# Patient Record
Sex: Male | Born: 1985 | Hispanic: Yes | Marital: Single | Smoking: Former smoker
Health system: Southern US, Community
[De-identification: ages and names within clinical notes are randomized; demographics above are authoritative.]

## PROBLEM LIST (undated history)

## (undated) DIAGNOSIS — R0600 Dyspnea, unspecified: Secondary | ICD-10-CM

## (undated) HISTORY — PX: NO PAST SURGERIES: SHX2092

---

## 2021-03-13 ENCOUNTER — Other Ambulatory Visit: Payer: Self-pay

## 2021-03-13 ENCOUNTER — Inpatient Hospital Stay (HOSPITAL_COMMUNITY)
Admission: EM | Admit: 2021-03-13 | Discharge: 2021-03-23 | DRG: 853 | Disposition: A | Discharge status: Home / Self Care | Payer: Self-pay | Attending: Internal Medicine | Admitting: Internal Medicine

## 2021-03-13 ENCOUNTER — Emergency Department (HOSPITAL_COMMUNITY): Payer: Self-pay

## 2021-03-13 ENCOUNTER — Encounter (HOSPITAL_COMMUNITY): Payer: Self-pay

## 2021-03-13 DIAGNOSIS — L03311 Cellulitis of abdominal wall: Secondary | ICD-10-CM | POA: Diagnosis present

## 2021-03-13 DIAGNOSIS — I96 Gangrene, not elsewhere classified: Secondary | ICD-10-CM | POA: Diagnosis present

## 2021-03-13 DIAGNOSIS — D62 Acute posthemorrhagic anemia: Secondary | ICD-10-CM | POA: Diagnosis not present

## 2021-03-13 DIAGNOSIS — D72810 Lymphocytopenia: Secondary | ICD-10-CM | POA: Diagnosis present

## 2021-03-13 DIAGNOSIS — E8809 Other disorders of plasma-protein metabolism, not elsewhere classified: Secondary | ICD-10-CM | POA: Diagnosis present

## 2021-03-13 DIAGNOSIS — Z87891 Personal history of nicotine dependence: Secondary | ICD-10-CM

## 2021-03-13 DIAGNOSIS — E872 Acidosis, unspecified: Secondary | ICD-10-CM | POA: Diagnosis present

## 2021-03-13 DIAGNOSIS — B955 Unspecified streptococcus as the cause of diseases classified elsewhere: Secondary | ICD-10-CM | POA: Diagnosis present

## 2021-03-13 DIAGNOSIS — E871 Hypo-osmolality and hyponatremia: Secondary | ICD-10-CM | POA: Diagnosis present

## 2021-03-13 DIAGNOSIS — R6521 Severe sepsis with septic shock: Secondary | ICD-10-CM | POA: Diagnosis present

## 2021-03-13 DIAGNOSIS — D709 Neutropenia, unspecified: Secondary | ICD-10-CM | POA: Diagnosis present

## 2021-03-13 DIAGNOSIS — R748 Abnormal levels of other serum enzymes: Secondary | ICD-10-CM | POA: Diagnosis present

## 2021-03-13 DIAGNOSIS — S0512XA Contusion of eyeball and orbital tissues, left eye, initial encounter: Secondary | ICD-10-CM | POA: Diagnosis present

## 2021-03-13 DIAGNOSIS — D65 Disseminated intravascular coagulation [defibrination syndrome]: Secondary | ICD-10-CM | POA: Diagnosis present

## 2021-03-13 DIAGNOSIS — K769 Liver disease, unspecified: Secondary | ICD-10-CM

## 2021-03-13 DIAGNOSIS — S31109A Unspecified open wound of abdominal wall, unspecified quadrant without penetration into peritoneal cavity, initial encounter: Secondary | ICD-10-CM | POA: Diagnosis present

## 2021-03-13 DIAGNOSIS — J9600 Acute respiratory failure, unspecified whether with hypoxia or hypercapnia: Secondary | ICD-10-CM | POA: Diagnosis present

## 2021-03-13 DIAGNOSIS — N179 Acute kidney failure, unspecified: Secondary | ICD-10-CM | POA: Diagnosis present

## 2021-03-13 DIAGNOSIS — A400 Sepsis due to streptococcus, group A: Principal | ICD-10-CM | POA: Diagnosis present

## 2021-03-13 DIAGNOSIS — E8770 Fluid overload, unspecified: Secondary | ICD-10-CM | POA: Diagnosis not present

## 2021-03-13 DIAGNOSIS — K831 Obstruction of bile duct: Secondary | ICD-10-CM

## 2021-03-13 DIAGNOSIS — Z20822 Contact with and (suspected) exposure to covid-19: Secondary | ICD-10-CM | POA: Diagnosis present

## 2021-03-13 DIAGNOSIS — W503XXA Accidental bite by another person, initial encounter: Secondary | ICD-10-CM

## 2021-03-13 DIAGNOSIS — R7881 Bacteremia: Secondary | ICD-10-CM | POA: Diagnosis present

## 2021-03-13 DIAGNOSIS — S3991XA Unspecified injury of abdomen, initial encounter: Secondary | ICD-10-CM

## 2021-03-13 DIAGNOSIS — M60051 Infective myositis, right thigh: Secondary | ICD-10-CM | POA: Diagnosis present

## 2021-03-13 DIAGNOSIS — R7401 Elevation of levels of liver transaminase levels: Secondary | ICD-10-CM | POA: Diagnosis present

## 2021-03-13 DIAGNOSIS — M60052 Infective myositis, left thigh: Secondary | ICD-10-CM | POA: Diagnosis present

## 2021-03-13 DIAGNOSIS — L02216 Cutaneous abscess of umbilicus: Secondary | ICD-10-CM | POA: Diagnosis present

## 2021-03-13 DIAGNOSIS — J9601 Acute respiratory failure with hypoxia: Secondary | ICD-10-CM | POA: Diagnosis not present

## 2021-03-13 DIAGNOSIS — S0003XA Contusion of scalp, initial encounter: Secondary | ICD-10-CM | POA: Diagnosis present

## 2021-03-13 DIAGNOSIS — A419 Sepsis, unspecified organism: Secondary | ICD-10-CM | POA: Diagnosis present

## 2021-03-13 DIAGNOSIS — S31153A Open bite of abdominal wall, right lower quadrant without penetration into peritoneal cavity, initial encounter: Secondary | ICD-10-CM | POA: Diagnosis present

## 2021-03-13 DIAGNOSIS — S0083XA Contusion of other part of head, initial encounter: Secondary | ICD-10-CM

## 2021-03-13 HISTORY — DX: Dyspnea, unspecified: R06.00

## 2021-03-13 LAB — CBC WITH DIFFERENTIAL/PLATELET
Abs Immature Granulocytes: 0.82 10*3/uL — ABNORMAL HIGH (ref 0.00–0.07)
Basophils Absolute: 0.1 10*3/uL (ref 0.0–0.1)
Basophils Relative: 5 %
Eosinophils Absolute: 0 10*3/uL (ref 0.0–0.5)
Eosinophils Relative: 1 %
HCT: 44.6 % (ref 39.0–52.0)
Hemoglobin: 15.6 g/dL (ref 13.0–17.0)
Immature Granulocytes: 62 %
Lymphocytes Relative: 6 %
Lymphs Abs: 0.1 10*3/uL — ABNORMAL LOW (ref 0.7–4.0)
MCH: 29 pg (ref 26.0–34.0)
MCHC: 35 g/dL (ref 30.0–36.0)
MCV: 82.9 fL (ref 80.0–100.0)
Monocytes Absolute: 0.1 10*3/uL (ref 0.1–1.0)
Monocytes Relative: 5 %
Neutro Abs: 0.3 10*3/uL — CL (ref 1.7–7.7)
Neutrophils Relative %: 21 %
Platelets: 153 10*3/uL (ref 150–400)
RBC: 5.38 MIL/uL (ref 4.22–5.81)
RDW: 12.2 % (ref 11.5–15.5)
WBC Morphology: INCREASED
WBC: 1.3 10*3/uL — CL (ref 4.0–10.5)
nRBC: 0 % (ref 0.0–0.2)

## 2021-03-13 LAB — RETICULOCYTES
Immature Retic Fract: 10.3 % (ref 2.3–15.9)
RBC.: 4.71 MIL/uL (ref 4.22–5.81)
Retic Count, Absolute: 80.1 10*3/uL (ref 19.0–186.0)
Retic Ct Pct: 1.7 % (ref 0.4–3.1)

## 2021-03-13 LAB — BASIC METABOLIC PANEL
Anion gap: 14 (ref 5–15)
Anion gap: 13 (ref 5–15)
BUN: 37 mg/dL — ABNORMAL HIGH (ref 6–20)
BUN: 36 mg/dL — ABNORMAL HIGH (ref 6–20)
CO2: 21 mmol/L — ABNORMAL LOW (ref 22–32)
CO2: 22 mmol/L (ref 22–32)
Calcium: 7.3 mg/dL — ABNORMAL LOW (ref 8.9–10.3)
Calcium: 7.4 mg/dL — ABNORMAL LOW (ref 8.9–10.3)
Chloride: 96 mmol/L — ABNORMAL LOW (ref 98–111)
Chloride: 98 mmol/L (ref 98–111)
Creatinine, Ser: 2.13 mg/dL — ABNORMAL HIGH (ref 0.61–1.24)
Creatinine, Ser: 1.91 mg/dL — ABNORMAL HIGH (ref 0.61–1.24)
GFR, Estimated: 40 mL/min — ABNORMAL LOW (ref >60)
GFR, Estimated: 46 mL/min — ABNORMAL LOW (ref >60)
Glucose, Bld: 92 mg/dL (ref 70–99)
Glucose, Bld: 95 mg/dL (ref 70–99)
Potassium: 3.6 mmol/L (ref 3.5–5.1)
Potassium: 3.7 mmol/L (ref 3.5–5.1)
Sodium: 131 mmol/L — ABNORMAL LOW (ref 135–145)
Sodium: 133 mmol/L — ABNORMAL LOW (ref 135–145)

## 2021-03-13 LAB — BLOOD CULTURE ID PANEL (REFLEXED) - BCID2

## 2021-03-13 LAB — COMPREHENSIVE METABOLIC PANEL
ALT: 33 U/L (ref 0–44)
AST: 62 U/L — ABNORMAL HIGH (ref 15–41)
Albumin: 2.6 g/dL — ABNORMAL LOW (ref 3.5–5.0)
Alkaline Phosphatase: 68 U/L (ref 38–126)
Anion gap: 17 — ABNORMAL HIGH (ref 5–15)
BUN: 47 mg/dL — ABNORMAL HIGH (ref 6–20)
CO2: 24 mmol/L (ref 22–32)
Calcium: 7.9 mg/dL — ABNORMAL LOW (ref 8.9–10.3)
Chloride: 83 mmol/L — ABNORMAL LOW (ref 98–111)
Creatinine, Ser: 3.67 mg/dL — ABNORMAL HIGH (ref 0.61–1.24)
GFR, Estimated: 21 mL/min — ABNORMAL LOW (ref >60)
Glucose, Bld: 165 mg/dL — ABNORMAL HIGH (ref 70–99)
Potassium: 4.4 mmol/L (ref 3.5–5.1)
Sodium: 124 mmol/L — ABNORMAL LOW (ref 135–145)
Total Bilirubin: 1.4 mg/dL — ABNORMAL HIGH (ref 0.3–1.2)
Total Protein: 6.3 g/dL — ABNORMAL LOW (ref 6.5–8.1)

## 2021-03-13 LAB — URINALYSIS, ROUTINE W REFLEX MICROSCOPIC
Bilirubin Urine: NEGATIVE
Glucose, UA: NEGATIVE mg/dL
Ketones, ur: NEGATIVE mg/dL
Leukocytes,Ua: NEGATIVE
Nitrite: NEGATIVE
Protein, ur: 30 mg/dL — AB
Specific Gravity, Urine: 1.019 (ref 1.005–1.030)
pH: 5 (ref 5.0–8.0)

## 2021-03-13 LAB — PROTEIN / CREATININE RATIO, URINE
Creatinine, Urine: 181.86 mg/dL
Protein Creatinine Ratio: 1.03 mg/mg{Cre} — ABNORMAL HIGH (ref 0.00–0.15)
Total Protein, Urine: 187 mg/dL

## 2021-03-13 LAB — ETHANOL: Alcohol, Ethyl (B): <10 mg/dL (ref <10)

## 2021-03-13 LAB — OSMOLALITY, URINE: Osmolality, Ur: 374 mOsm/kg (ref 300–900)

## 2021-03-13 LAB — SODIUM, URINE, RANDOM: Sodium, Ur: <10 mmol/L

## 2021-03-13 LAB — CBC
HCT: 42.2 % (ref 39.0–52.0)
Hemoglobin: 15.1 g/dL (ref 13.0–17.0)
MCH: 29.7 pg (ref 26.0–34.0)
MCHC: 35.8 g/dL (ref 30.0–36.0)
MCV: 82.9 fL (ref 80.0–100.0)
Platelets: 148 10*3/uL — ABNORMAL LOW (ref 150–400)
RBC: 5.09 MIL/uL (ref 4.22–5.81)
RDW: 12 % (ref 11.5–15.5)
WBC: 1.7 10*3/uL — ABNORMAL LOW (ref 4.0–10.5)
nRBC: 0 % (ref 0.0–0.2)

## 2021-03-13 LAB — DIC (DISSEMINATED INTRAVASCULAR COAGULATION)PANEL
D-Dimer, Quant: 13.11 ug/mL-FEU — ABNORMAL HIGH (ref 0.00–0.50)
Fibrinogen: >800 mg/dL — ABNORMAL HIGH (ref 210–475)
INR: 1.4 — ABNORMAL HIGH (ref 0.8–1.2)
Platelets: 95 10*3/uL — ABNORMAL LOW (ref 150–400)
Prothrombin Time: 17.3 seconds — ABNORMAL HIGH (ref 11.4–15.2)
Smear Review: NONE SEEN
aPTT: 35 seconds (ref 24–36)

## 2021-03-13 LAB — URIC ACID: Uric Acid, Serum: 7.4 mg/dL (ref 3.7–8.6)

## 2021-03-13 LAB — PROTIME-INR
INR: 1.2 (ref 0.8–1.2)
Prothrombin Time: 15.4 seconds — ABNORMAL HIGH (ref 11.4–15.2)

## 2021-03-13 LAB — CREATININE, URINE, RANDOM: Creatinine, Urine: 183.4 mg/dL

## 2021-03-13 LAB — RAPID URINE DRUG SCREEN, HOSP PERFORMED
Amphetamines: NOT DETECTED
Barbiturates: NOT DETECTED
Benzodiazepines: NOT DETECTED
Cocaine: NOT DETECTED
Opiates: POSITIVE — AB
Tetrahydrocannabinol: NOT DETECTED

## 2021-03-13 LAB — LACTIC ACID, PLASMA
Lactic Acid, Venous: 4.4 mmol/L (ref 0.5–1.9)
Lactic Acid, Venous: 3.3 mmol/L (ref 0.5–1.9)
Lactic Acid, Venous: 3 mmol/L (ref 0.5–1.9)
Lactic Acid, Venous: 2.7 mmol/L (ref 0.5–1.9)

## 2021-03-13 LAB — PATHOLOGIST SMEAR REVIEW

## 2021-03-13 LAB — LIPASE, BLOOD: Lipase: 18 U/L (ref 11–51)

## 2021-03-13 LAB — RESP PANEL BY RT-PCR (FLU A&B, COVID) ARPGX2
Influenza A by PCR: NEGATIVE
Influenza B by PCR: NEGATIVE
SARS Coronavirus 2 by RT PCR: NEGATIVE

## 2021-03-13 LAB — TECHNOLOGIST SMEAR REVIEW: Plt Morphology: NORMAL

## 2021-03-13 LAB — SAVE SMEAR(SSMR), FOR PROVIDER SLIDE REVIEW

## 2021-03-13 LAB — HIV ANTIBODY (ROUTINE TESTING W REFLEX): HIV Screen 4th Generation wRfx: NONREACTIVE

## 2021-03-13 LAB — APTT: aPTT: 34 seconds (ref 24–36)

## 2021-03-13 LAB — LACTATE DEHYDROGENASE: LDH: 227 U/L — ABNORMAL HIGH (ref 98–192)

## 2021-03-13 LAB — OSMOLALITY: Osmolality: 281 mOsm/kg (ref 275–295)

## 2021-03-13 LAB — MAGNESIUM: Magnesium: 2.1 mg/dL (ref 1.7–2.4)

## 2021-03-13 LAB — PHOSPHORUS: Phosphorus: 6.4 mg/dL — ABNORMAL HIGH (ref 2.5–4.6)

## 2021-03-13 LAB — CK: Total CK: 180 U/L (ref 49–397)
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
## 2021-03-13 MED ORDER — LACTATED RINGERS IV BOLUS (SEPSIS)
1000.0000 mL | Freq: Once | INTRAVENOUS | Status: AC
  Administered 2021-03-13: 1000 mL via INTRAVENOUS

## 2021-03-13 MED ORDER — SODIUM CHLORIDE 0.9 % IV BOLUS
1000.0000 mL | Freq: Once | INTRAVENOUS | Status: AC
  Administered 2021-03-13: 1000 mL via INTRAVENOUS

## 2021-03-13 MED ORDER — CLINDAMYCIN PHOSPHATE 600 MG/50ML IV SOLN
600.0000 mg | Freq: Three times a day (TID) | INTRAVENOUS | Status: AC
  Administered 2021-03-14 – 2021-03-16 (×10): 600 mg via INTRAVENOUS
  Filled 2021-03-13 (×11): qty 50

## 2021-03-13 MED ORDER — SODIUM CHLORIDE 0.9 % IV SOLN
2.0000 g | Freq: Once | INTRAVENOUS | Status: AC
  Administered 2021-03-13: 2 g via INTRAVENOUS
  Filled 2021-03-13: qty 20

## 2021-03-13 MED ORDER — PENICILLIN G POTASSIUM 20000000 UNITS IJ SOLR
4.0000 10*6.[IU] | Freq: Four times a day (QID) | INTRAVENOUS | Status: DC
  Administered 2021-03-13 – 2021-03-14 (×2): 4 10*6.[IU] via INTRAVENOUS
  Filled 2021-03-13 (×4): qty 4

## 2021-03-13 MED ORDER — ACETAMINOPHEN 325 MG PO TABS
650.0000 mg | ORAL_TABLET | Freq: Four times a day (QID) | ORAL | Status: DC | PRN
  Administered 2021-03-14: 650 mg via ORAL
  Filled 2021-03-13: qty 2

## 2021-03-13 MED ORDER — PENICILLIN G POTASSIUM 20000000 UNITS IJ SOLR
4.0000 10*6.[IU] | Freq: Four times a day (QID) | INTRAVENOUS | Status: DC
  Filled 2021-03-13 (×3): qty 4

## 2021-03-13 MED ORDER — ONDANSETRON HCL 4 MG/2ML IJ SOLN
4.0000 mg | Freq: Once | INTRAMUSCULAR | Status: DC
  Filled 2021-03-13: qty 2

## 2021-03-13 MED ORDER — LACTATED RINGERS IV BOLUS
1000.0000 mL | Freq: Once | INTRAVENOUS | Status: AC
  Administered 2021-03-13: 1000 mL via INTRAVENOUS

## 2021-03-13 MED ORDER — DEXTROSE 5 % IV SOLN: INTRAVENOUS | Status: DC

## 2021-03-13 MED ORDER — MORPHINE SULFATE (PF) 4 MG/ML IV SOLN
4.0000 mg | Freq: Once | INTRAVENOUS | Status: AC
  Administered 2021-03-13: 4 mg via INTRAVENOUS
  Filled 2021-03-13: qty 1

## 2021-03-13 MED ORDER — LACTATED RINGERS IV BOLUS (SEPSIS)
500.0000 mL | Freq: Once | INTRAVENOUS | Status: AC
  Administered 2021-03-13: 500 mL via INTRAVENOUS

## 2021-03-13 MED ORDER — FENTANYL CITRATE (PF) 100 MCG/2ML IJ SOLN
50.0000 ug | Freq: Once | INTRAMUSCULAR | Status: AC
  Administered 2021-03-13: 50 ug via INTRAVENOUS
  Filled 2021-03-13: qty 2

## 2021-03-13 MED ORDER — VANCOMYCIN HCL 1250 MG/250ML IV SOLN
1250.0000 mg | Freq: Once | INTRAVENOUS | Status: AC
  Administered 2021-03-13: 1250 mg via INTRAVENOUS
  Filled 2021-03-13: qty 250

## 2021-03-13 MED ORDER — ACETAMINOPHEN 650 MG RE SUPP: 650.0000 mg | Freq: Four times a day (QID) | RECTAL | Status: DC | PRN

## 2021-03-13 MED ORDER — VANCOMYCIN VARIABLE DOSE PER UNSTABLE RENAL FUNCTION (PHARMACIST DOSING): Status: DC

## 2021-03-13 MED ORDER — LACTATED RINGERS IV SOLN: INTRAVENOUS | Status: DC

## 2021-03-13 MED ORDER — HEPARIN SODIUM (PORCINE) 5000 UNIT/ML IJ SOLN
5000.0000 [IU] | Freq: Three times a day (TID) | INTRAMUSCULAR | Status: DC
  Filled 2021-03-13: qty 1
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
## 2021-03-13 NOTE — ED Notes (Signed)
Patient transported to CT 
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
## 2021-03-13 NOTE — ED Notes (Signed)
Nurse in aware of pts O2 levels

## 2021-03-13 NOTE — Progress Notes (Addendum)
PHARMACY - PHYSICIAN COMMUNICATION CRITICAL VALUE ALERT - BLOOD CULTURE IDENTIFICATION (BCID)  Bryce [name] is an 35 y.o. male who presented to [facility] on 03/13/2021 with a chief complaint of abdominal wall cellulitis  Assessment:  Leukopenic, Tachycardic, borderline hypotensive  Name of physician (or Provider) Contacted: Dr. Sloan Leiter and Dr. Ephriam Knuckles ([facility])  Current antibiotics: Vancomycin   Changes to prescribed antibiotics recommended:  DC Vancomycin  Add IV Pen G 4 million units q6h  Add Clindamycin 600 mg IV q8h  Results for orders placed or performed during the hospital encounter of 03/13/21  Blood Culture ID Panel (Reflexed) (Collected: 03/13/2021  8:19 AM)  Result Value Ref Range   Enterococcus faecalis NOT DETECTED NOT DETECTED   Enterococcus Faecium NOT DETECTED NOT DETECTED   Listeria monocytogenes NOT DETECTED NOT DETECTED   Staphylococcus species NOT DETECTED NOT DETECTED   Staphylococcus aureus (BCID) NOT DETECTED NOT DETECTED   Staphylococcus epidermidis NOT DETECTED NOT DETECTED   Staphylococcus lugdunensis NOT DETECTED NOT DETECTED   Streptococcus species DETECTED (A) NOT DETECTED   Streptococcus agalactiae NOT DETECTED NOT DETECTED   Streptococcus pneumoniae NOT DETECTED NOT DETECTED   Streptococcus pyogenes DETECTED (A) NOT DETECTED   A.calcoaceticus-baumannii NOT DETECTED NOT DETECTED   Bacteroides fragilis NOT DETECTED NOT DETECTED   Enterobacterales NOT DETECTED NOT DETECTED   Enterobacter cloacae complex NOT DETECTED NOT DETECTED   Escherichia coli NOT DETECTED NOT DETECTED   Klebsiella aerogenes NOT DETECTED NOT DETECTED   Klebsiella oxytoca NOT DETECTED NOT DETECTED   Klebsiella pneumoniae NOT DETECTED NOT DETECTED   Proteus species NOT DETECTED NOT DETECTED   Salmonella species NOT DETECTED NOT DETECTED   Serratia marcescens NOT DETECTED NOT DETECTED   Haemophilus influenzae NOT DETECTED NOT DETECTED   Neisseria meningitidis NOT  DETECTED NOT DETECTED   Pseudomonas aeruginosa NOT DETECTED NOT DETECTED   Stenotrophomonas maltophilia NOT DETECTED NOT DETECTED   Candida albicans NOT DETECTED NOT DETECTED   Candida auris NOT DETECTED NOT DETECTED   Candida glabrata NOT DETECTED NOT DETECTED   Candida krusei NOT DETECTED NOT DETECTED   Candida parapsilosis NOT DETECTED NOT DETECTED   Candida tropicalis NOT DETECTED NOT DETECTED   Cryptococcus neoformans/gattii NOT DETECTED NOT DETECTED    Abran Duke 03/13/2021  10:29 PM

## 2021-03-13 NOTE — ED Notes (Signed)
Called lab concerning pending Lactic, was told that there is still some blood in centrifuge and they will check as soon as it is finished spinning. Will send another lactic at this time, however pt has had approx 1.5 L of fluid

## 2021-03-13 NOTE — ED Notes (Signed)
Pt returned from CT at this time.  

## 2021-03-13 NOTE — Sepsis Progress Note (Signed)
ELINK tracking the Code Sepsis. 
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
## 2021-03-13 NOTE — ED Notes (Signed)
Attempted to call report
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
## 2021-03-13 NOTE — ED Notes (Signed)
Resent lactic at this time, called lab to make sure they got it and are running it now. Was told they just received it and will run it right away. Also asked they please result the first lactic that was sent earlier as soon as the results become available.

## 2021-03-13 NOTE — Progress Notes (Signed)
   03/13/21 1816  Assess: MEWS Score  Temp 97.8 F (36.6 C)  BP 95/74  Pulse Rate (!) 133  ECG Heart Rate (!) 132  Resp (!) 26  Level of Consciousness Alert  O2 Device Nasal Cannula  O2 Flow Rate (L/min) 2 L/min  Assess: MEWS Score  MEWS Temp 0  MEWS Systolic 1  MEWS Pulse 3  MEWS RR 2  MEWS LOC 0  MEWS Score 6  MEWS Score Color Red  Assess: if the MEWS score is Yellow or Red  Were vital signs taken at a resting state? No  Focused Assessment No change from prior assessment  Early Detection of Sepsis Score *See Row Information* Low  MEWS guidelines implemented *See Row Information* Yes  Treat  MEWS Interventions Administered scheduled meds/treatments;Administered prn meds/treatments;Escalated (See documentation below)  Pain Scale 0-10  Pain Score 5  Take Vital Signs  Increase Vital Sign Frequency  Red: Q 1hr X 4 then Q 4hr X 4, if remains red, continue Q 4hrs  Escalate  MEWS: Escalate Red: discuss with charge nurse/RN and provider, consider discussing with RRT  Notify: Charge Nurse/RN  Name of Charge Nurse/RN Notified erin, RN  Date Charge Nurse/RN Notified 03/13/21  Time Charge Nurse/RN Notified 1819  Document  Patient Outcome Other (Comment) (patient remains stable)  Progress note created (see row info) Yes
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
## 2021-03-13 NOTE — Progress Notes (Signed)
Pharmacy Antibiotic Note  Geroge Gilliam is a 35 y.o. male admitted on 03/13/2021 with sepsis.  Pharmacy has been consulted for vancomycin dosing.  Plan: Vancomycin 1250mg  x1 then dose based on levels given Cr elevation to 3.67mg /dL Variable vancomycin order placed on MAR  -Monitor renal function, clinical status, and antibiotic plan  Height: 5\' 5"  (165.1 cm) Weight: 59 kg (130 lb) IBW/kg (Calculated) : 61.5  Temp (24hrs), Avg:97.7 F (36.5 C), Min:97.7 F (36.5 C), Max:97.7 F (36.5 C)  Recent Labs  Lab 03/13/21 0724  WBC 1.7*  CREATININE 3.67*    Estimated Creatinine Clearance: 23.2 mL/min (A) (by C-G formula based on SCr of 3.67 mg/dL (H)).    Not on File  Antimicrobials this admission: Vanc 7/13 >>  Ceftriaxone 7/13 >>  Microbiology results: 7/13 BCx: 7/13 UCx:    Thank you for allowing pharmacy to be a part of this patient's care.  8/13, PharmD, [facility] Emergency Medicine Clinical Pharmacist ED RPh Phone: [phone] Main RX: [phone]

## 2021-03-13 NOTE — Sepsis Progress Note (Signed)
Notified bedside nurse of need to draw repeat lactic acid after completion of Fluid Resuscitation.

## 2021-03-14 ENCOUNTER — Encounter (HOSPITAL_COMMUNITY): Payer: Self-pay | Admitting: Student in an Organized Health Care Education/Training Program

## 2021-03-14 ENCOUNTER — Inpatient Hospital Stay (HOSPITAL_COMMUNITY): Payer: Self-pay

## 2021-03-14 DIAGNOSIS — Z789 Other specified health status: Secondary | ICD-10-CM

## 2021-03-14 DIAGNOSIS — B955 Unspecified streptococcus as the cause of diseases classified elsewhere: Secondary | ICD-10-CM

## 2021-03-14 DIAGNOSIS — L03311 Cellulitis of abdominal wall: Secondary | ICD-10-CM

## 2021-03-14 DIAGNOSIS — R6520 Severe sepsis without septic shock: Secondary | ICD-10-CM

## 2021-03-14 DIAGNOSIS — A400 Sepsis due to streptococcus, group A: Principal | ICD-10-CM

## 2021-03-14 DIAGNOSIS — R7881 Bacteremia: Secondary | ICD-10-CM | POA: Diagnosis present

## 2021-03-14 DIAGNOSIS — D709 Neutropenia, unspecified: Secondary | ICD-10-CM

## 2021-03-14 HISTORY — DX: Other specified health status: Z78.9

## 2021-03-14 LAB — CBC
HCT: 36.1 % — ABNORMAL LOW (ref 39.0–52.0)
Hemoglobin: 12.7 g/dL — ABNORMAL LOW (ref 13.0–17.0)
MCH: 29.1 pg (ref 26.0–34.0)
MCHC: 35.2 g/dL (ref 30.0–36.0)
MCV: 82.8 fL (ref 80.0–100.0)
Platelets: 86 10*3/uL — ABNORMAL LOW (ref 150–400)
RBC: 4.36 MIL/uL (ref 4.22–5.81)
RDW: 12.7 % (ref 11.5–15.5)
WBC: 6.4 10*3/uL (ref 4.0–10.5)
nRBC: 0.3 % — ABNORMAL HIGH (ref 0.0–0.2)

## 2021-03-14 LAB — URINE CULTURE: Culture: NO GROWTH

## 2021-03-14 LAB — BASIC METABOLIC PANEL
Anion gap: 14 (ref 5–15)
BUN: 32 mg/dL — ABNORMAL HIGH (ref 6–20)
CO2: 23 mmol/L (ref 22–32)
Calcium: 7.6 mg/dL — ABNORMAL LOW (ref 8.9–10.3)
Chloride: 95 mmol/L — ABNORMAL LOW (ref 98–111)
Creatinine, Ser: 1.55 mg/dL — ABNORMAL HIGH (ref 0.61–1.24)
GFR, Estimated: 59 mL/min — ABNORMAL LOW (ref >60)
Glucose, Bld: 127 mg/dL — ABNORMAL HIGH (ref 70–99)
Potassium: 3.7 mmol/L (ref 3.5–5.1)
Sodium: 132 mmol/L — ABNORMAL LOW (ref 135–145)

## 2021-03-14 LAB — DIC (DISSEMINATED INTRAVASCULAR COAGULATION)PANEL
D-Dimer, Quant: 18.26 ug/mL-FEU — ABNORMAL HIGH (ref 0.00–0.50)
Fibrinogen: >800 mg/dL — ABNORMAL HIGH (ref 210–475)
INR: 1.4 — ABNORMAL HIGH (ref 0.8–1.2)
Platelets: 93 10*3/uL — ABNORMAL LOW (ref 150–400)
Prothrombin Time: 17.2 seconds — ABNORMAL HIGH (ref 11.4–15.2)
Smear Review: NONE SEEN
aPTT: 37 seconds — ABNORMAL HIGH (ref 24–36)

## 2021-03-14 LAB — HEPATITIS C ANTIBODY: HCV Ab: NONREACTIVE

## 2021-03-14 LAB — LACTIC ACID, PLASMA: Lactic Acid, Venous: 2 mmol/L (ref 0.5–1.9)

## 2021-03-14 IMAGING — CT CT ABD-PELV W/O CM
2 of 4 series · 16 of 46 positions shown, 18 images · non-contrast
Comparison: CT of the chest abdomen pelvis dated [DATE].

CLINICAL DATA: 34-year-old male with abdominal pain and fever.

EXAM:
CT ABDOMEN AND PELVIS WITHOUT CONTRAST
TECHNIQUE: Multidetector CT imaging of the abdomen and pelvis was performed
following the standard protocol without IV contrast.

[Series 3: ap without · axial · non-contrast · 0.88mm/px · z∈[+915,+1365]mm · 13 of 104 slices shown, 15 images]
[im 7/104  soft-tissue]
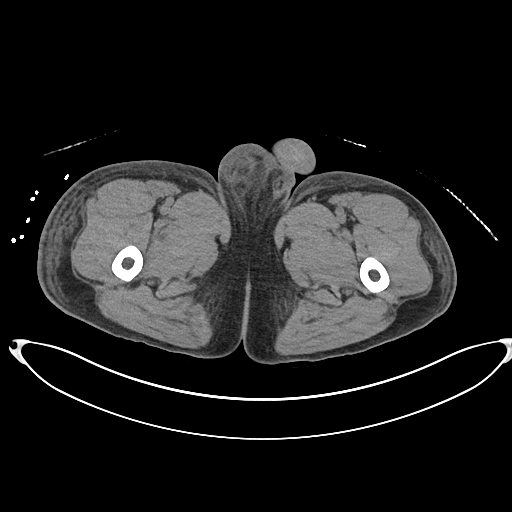
[im 7/104  bone]
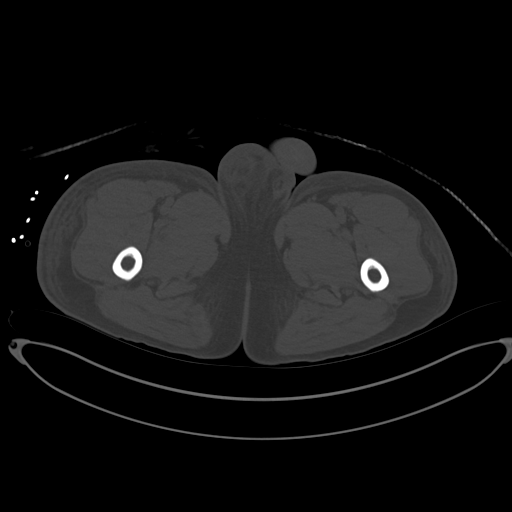
[im 13/104  soft-tissue]
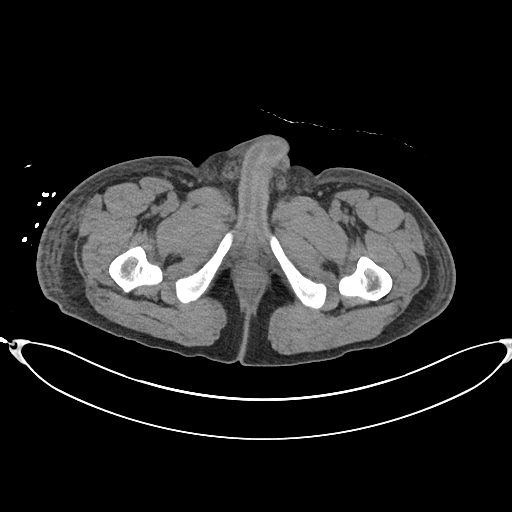
[im 25/104  soft-tissue]
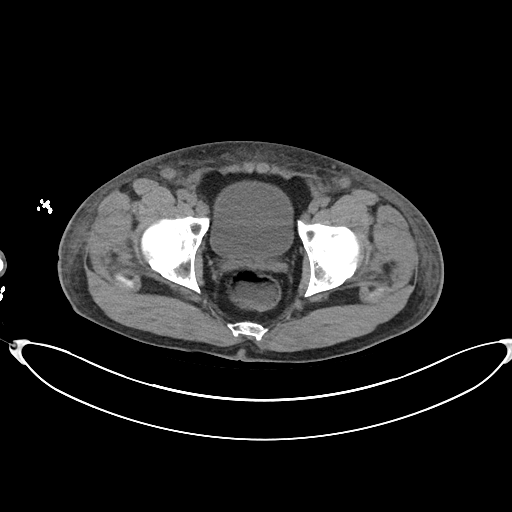
[im 31/104  soft-tissue]
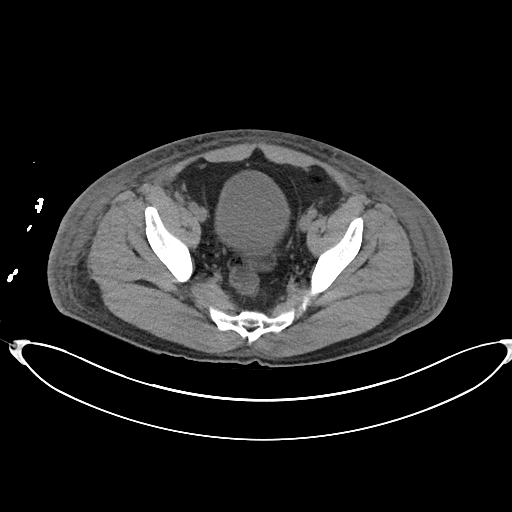
[im 37/104  soft-tissue]
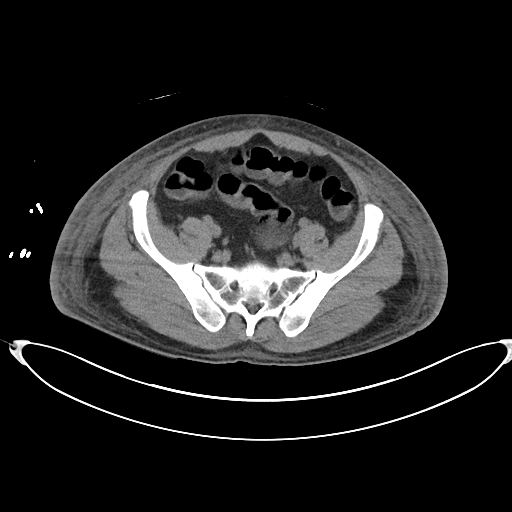
[im 43/104  soft-tissue]
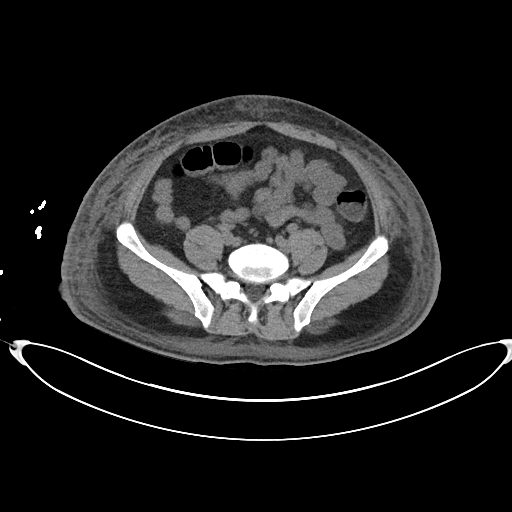
[im 55/104  soft-tissue]
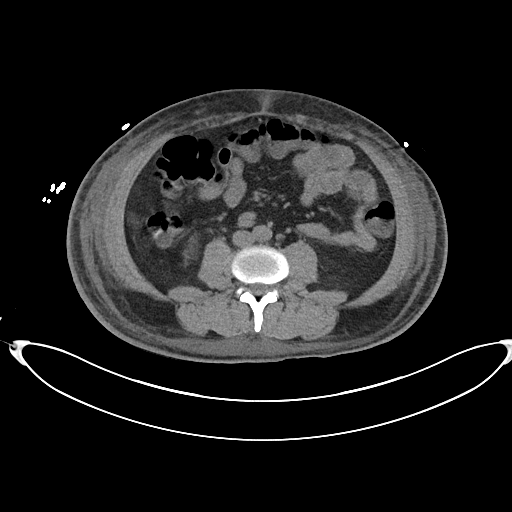
[im 61/104  soft-tissue]
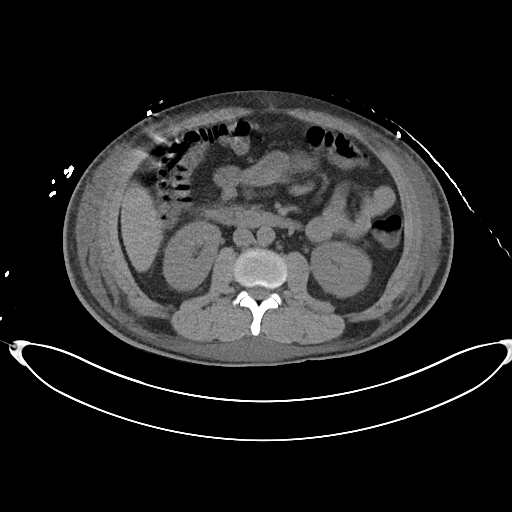
[im 67/104  soft-tissue]
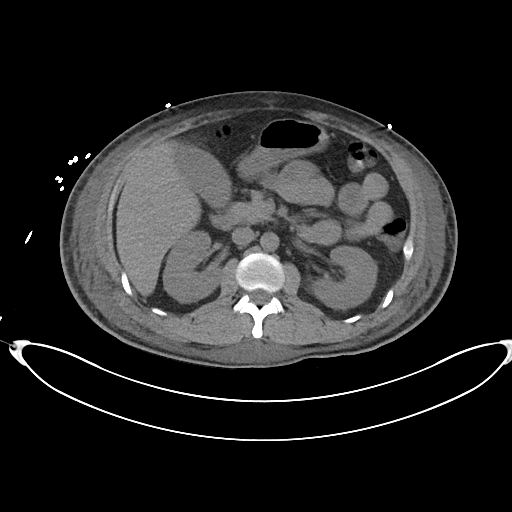
[im 67/104  bone]
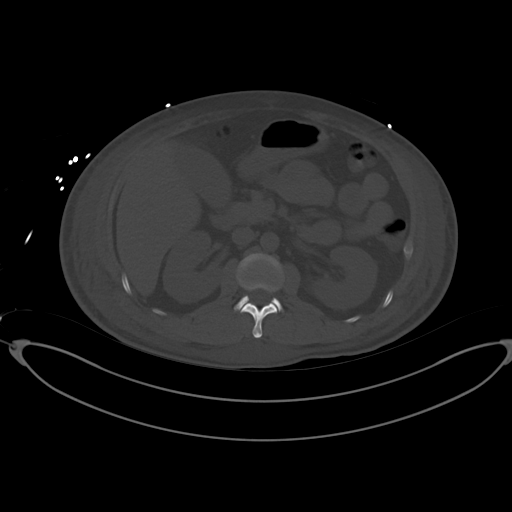
[im 73/104  soft-tissue]
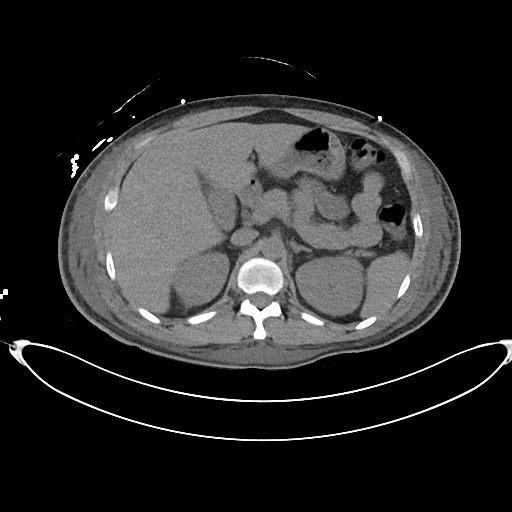
[im 79/104  soft-tissue]
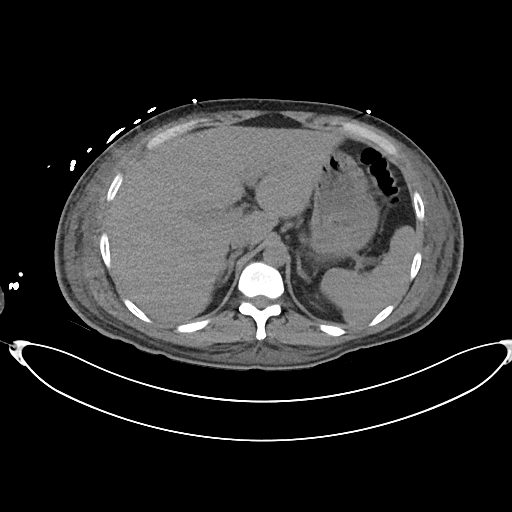
[im 91/104  soft-tissue]
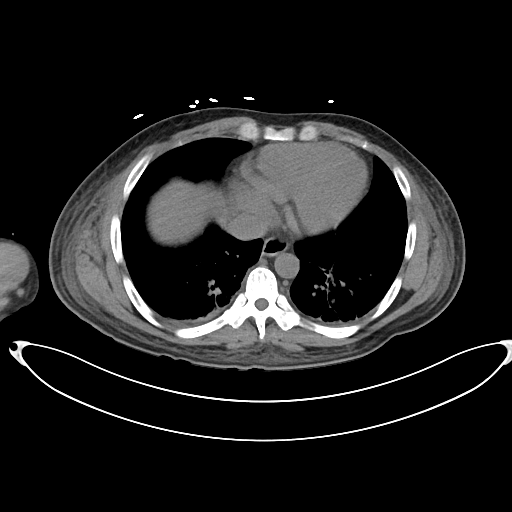
[im 97/104  soft-tissue]
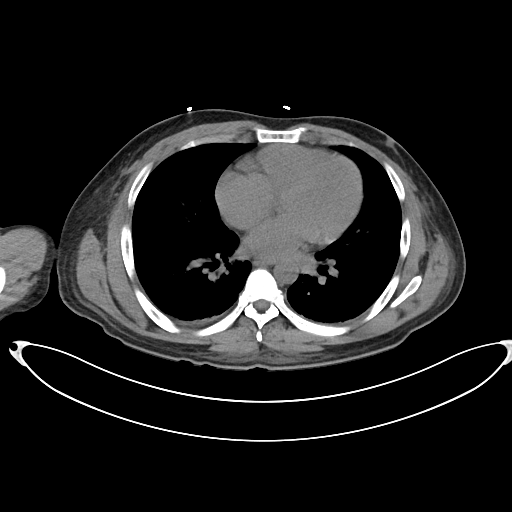

[Series 6: cor · coronal · 0.79mm/px · 3 of 84 slices shown]
[im 28/84  soft-tissue]
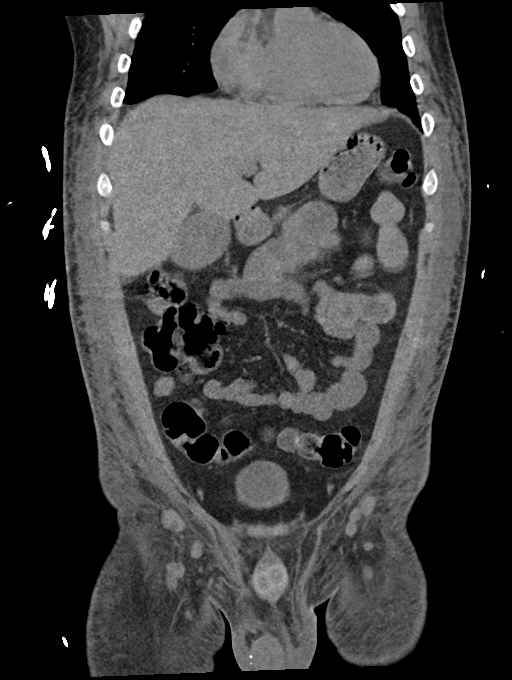
[im 37/84  soft-tissue]
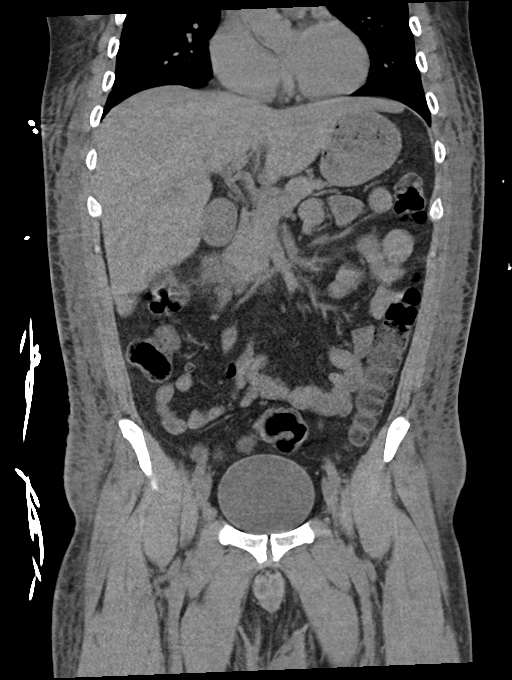
[im 47/84  soft-tissue]
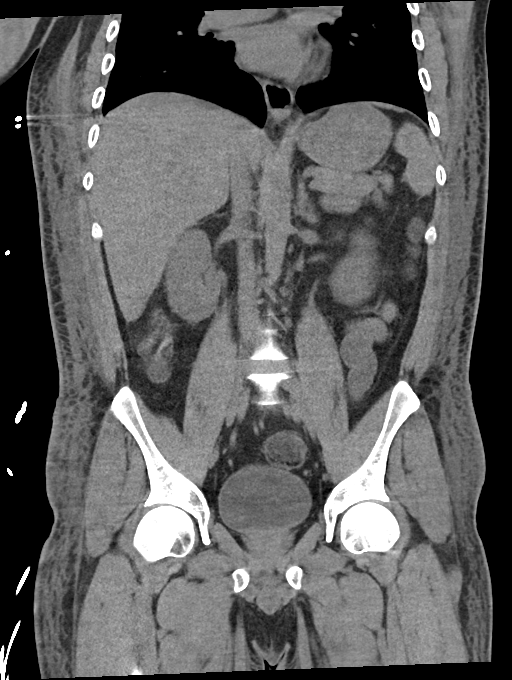

[16 of 46 positions shown; findings below may reference images not displayed]

FINDINGS: Evaluation of this exam is limited in the absence of intravenous
contrast.

Lower chest: Bibasilar patchy and streaky atelectasis.

No intra-abdominal free air or free fluid.

Hepatobiliary: No focal liver abnormality is seen. No gallstones,
gallbladder wall thickening, or biliary dilatation.

Pancreas: Unremarkable. No pancreatic ductal dilatation or
surrounding inflammatory changes.

Spleen: Normal in size without focal abnormality.

Adrenals/Urinary Tract: The adrenal glands are unremarkable. The
kidneys, visualized ureters, and urinary bladder appear
unremarkable.

Stomach/Bowel: There is loose stool throughout the colon compatible
with diarrheal state. Correlation with clinical exam and stool
cultures recommended. There is no bowel obstruction or active
inflammation. The appendix is normal.

Vascular/Lymphatic: The abdominal aorta and IVC are grossly
unremarkable on this noncontrast CT. No portal venous gas. There is
no adenopathy.

Reproductive: The prostate and seminal vesicles are grossly
unremarkable. No pelvic mass.

Other: There is diffuse subcutaneous edema and anasarca, worsened
since the prior CT. No fluid collection.

Musculoskeletal: No acute or significant osseous findings.
IMPRESSION: 1. Diarrheal state. Correlation with clinical exam and stool
cultures recommended. No bowel obstruction. Normal appendix.
2. Diffuse subcutaneous edema and anasarca, worsened since the prior
CT.

## 2021-03-14 IMAGING — DX DG CHEST 1V PORT
1 series · 1 of 1 positions shown · non-contrast
Comparison: [DATE]

CLINICAL DATA: Acute respiratory failure.

EXAM:
PORTABLE CHEST 1 VIEW

[chest ap]
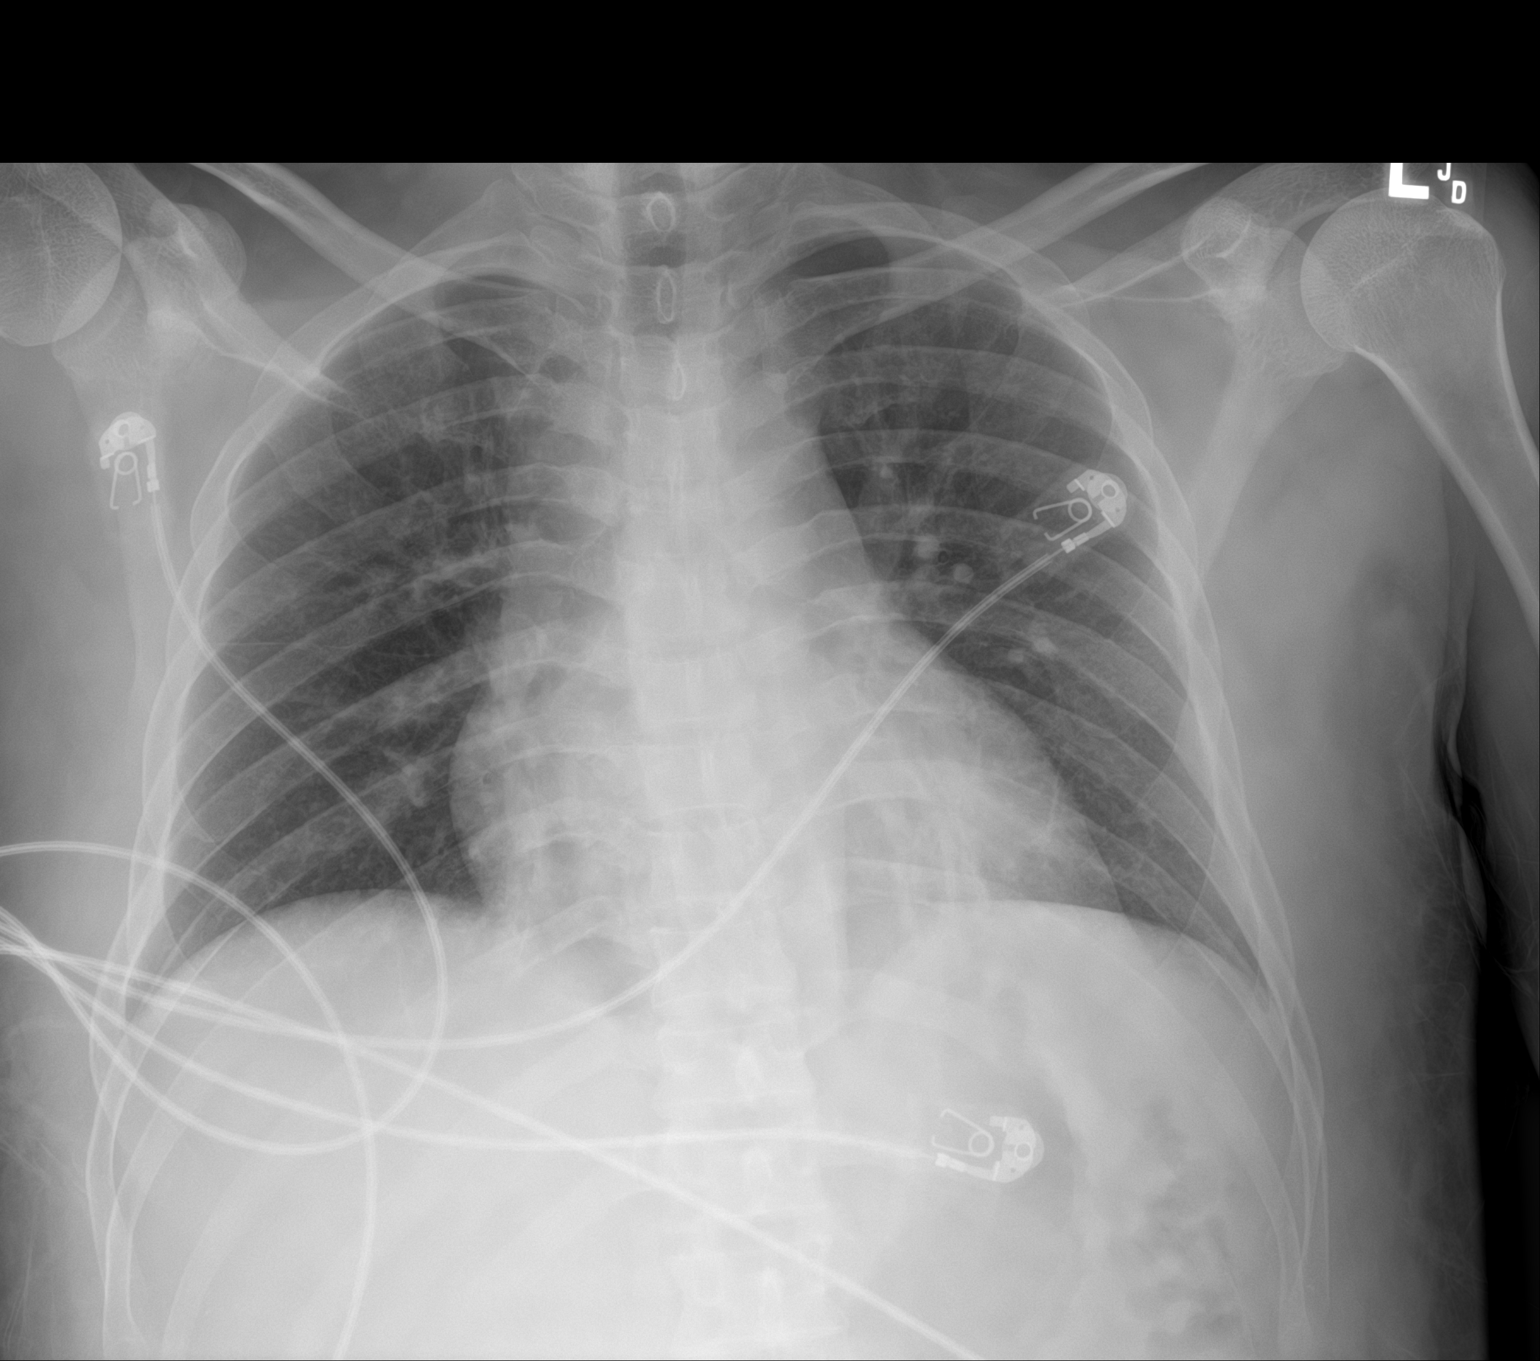

[1 of 1 positions shown; findings below may reference images not displayed]

FINDINGS: The heart size and mediastinal contours are within normal limits.
Low lung volumes with bibasilar atelectasis. No pleural effusion. No
pneumothorax. The visualized skeletal structures are unchanged.
IMPRESSION: Low lung volumes with bibasilar atelectasis.

## 2021-03-14 MED ORDER — RIVAROXABAN 10 MG PO TABS
10.0000 mg | ORAL_TABLET | Freq: Every day | ORAL | Status: DC
  Administered 2021-03-14: 10 mg via ORAL
  Filled 2021-03-14: qty 1

## 2021-03-14 MED ORDER — PENICILLIN G POTASSIUM 20000000 UNITS IJ SOLR
4.0000 10*6.[IU] | INTRAVENOUS | Status: DC
  Administered 2021-03-14: 4 10*6.[IU] via INTRAVENOUS
  Filled 2021-03-14 (×6): qty 4

## 2021-03-14 MED ORDER — SODIUM CHLORIDE 0.9 % IV BOLUS
1000.0000 mL | Freq: Once | INTRAVENOUS | Status: AC
  Administered 2021-03-14: 1000 mL via INTRAVENOUS

## 2021-03-14 MED ORDER — HYDROCODONE-ACETAMINOPHEN 5-325 MG PO TABS
1.0000 | ORAL_TABLET | Freq: Four times a day (QID) | ORAL | Status: DC | PRN
  Administered 2021-03-14 – 2021-03-15 (×2): 1 via ORAL
  Filled 2021-03-14 (×2): qty 1

## 2021-03-14 MED ORDER — SODIUM CHLORIDE 0.9 % IV SOLN: INTRAVENOUS | Status: DC

## 2021-03-14 MED ORDER — PENICILLIN G POTASSIUM 20000000 UNITS IJ SOLR
12.0000 10*6.[IU] | Freq: Two times a day (BID) | INTRAVENOUS | Status: DC
  Administered 2021-03-14 – 2021-03-17 (×6): 12 10*6.[IU] via INTRAVENOUS
  Filled 2021-03-14 (×7): qty 12

## 2021-03-14 MED ORDER — LACTATED RINGERS IV SOLN: INTRAVENOUS | Status: DC

## 2021-03-14 MED ORDER — MORPHINE SULFATE (PF) 2 MG/ML IV SOLN
4.0000 mg | INTRAVENOUS | Status: DC | PRN
Start: 2021-03-14 — End: 2021-03-23
  Administered 2021-03-14: 4 mg via INTRAVENOUS
  Administered 2021-03-17: 2 mg via INTRAVENOUS
  Administered 2021-03-17: 4 mg via INTRAVENOUS
  Administered 2021-03-17: 2 mg via INTRAVENOUS
  Administered 2021-03-18 – 2021-03-21 (×17): 4 mg via INTRAVENOUS
  Filled 2021-03-14 (×21): qty 2

## 2021-03-14 MED ORDER — OXYCODONE HCL 5 MG PO TABS: 5.0000 mg | ORAL_TABLET | ORAL | Status: DC | PRN

## 2021-03-14 MED ORDER — NALOXONE HCL 0.4 MG/ML IJ SOLN: 0.4000 mg | INTRAMUSCULAR | Status: DC | PRN

## 2021-03-14 NOTE — Progress Notes (Signed)
IMTS Paged to bedside by staff for new bilateral flank rashes with pain. Patient was seen at bedside. He states that for the past several hours he has noticed bilateral pain on his thighs. He states that the pain is uncomfortable, and has difficulty characterizing it.  The pain is worsened with his pants rub against it.  He states that he received pain medications 30 minutes ago but the pain still remains.  He requests assistance with taking off his jogger's.   Physical Exam Constitutional:      Appearance: He is toxic-appearing and diaphoretic.     Comments: Ill appearing, answers questions appropriately. Diaphoretic.   Cardiovascular:     Rate and Rhythm: Regular rhythm. Tachycardia present.     Pulses: Normal pulses.  Pulmonary:     Effort: Pulmonary effort is normal. No respiratory distress.     Comments: Saturating at 98% on room air.  Abdominal:     General: There is distension.     Comments: Indurated central eschar inferior to the umbilicus. Bilateral skin flank rashes with skin sloughing and multiple fluid filled vesicles, draining clear fluid.   Neurological:     Mental Status: He is alert and oriented to person, place, and time.       A/P Necrotizing Soft Tissue Infection Presented to the patient's room for new skin findings and pain. Lesions are concerning for necrotizing soft tissue infection. Mentation is intact, he is saturating well on room air. Tachycardic to the 100s-110s. Given new findings with consult with general surgery. Discussed new findings with ID and will continue with current antibiotic regimen.  - Consult to general surgery, appreciate their recommendations - Continue penicillin G and clindamycin
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
## 2021-03-14 NOTE — Progress Notes (Addendum)
OVERNIGHT ROUNDING NOTE  Summary: 34yom admitted with strep pyogenes sepsis secondary to an abdominal wound.   Interm hx:  Blood pressures over today have decreased a little--now with MAPs ranging in the mid 60s-low 70s. Remains tachycardic in the 110s-120s O2 saturations have also been declining over today, now requiring 2L supplemental oxygen.   CT a/p was obtained earlier today to evaluate for soft tissue necrosis in the setting of a new rash that had developed today. CT findings were not reflective of gas forming/necrotizing soft tissue infection however did note interm worsening of anasarca.   Subjective: pt seen at bedside with the use of interpreter. He reports feeling better today than yesterday. He denies any shortness of breath. He is feeling hungry and thirsty today.  On exam, he has a purpuric rash on the bilateral flanks with intact bullae. Right flank has an area of necrotic tissue. See images under media. He is breathing comfortably on 2L Bryce [name]. He has bilateral crackles worse at the bases. He has pitting edema in gravity dependent regions.   Assessment: suspect this new acute hypoxic respiratory failure and anasarca  is attributable to third spacing in the setting of having received approximately 12L of fluids since admission. He has certainly received sufficient volume repletion. Suspect this third spacing is from combination of volume he has received and sepsis physiology. Lactate continues to improve indicating adequate perfusion CXR shows maybe a little cephalization but does not look terrible in terms of pulmonary edema.   Plan:  -decrease IVF to 75cc/hr -if pressures decline, would favor starting pressors over further fluid boluses. Will need to get PCCM on board if this is the case   Addendum #1 11:47 PM: Blood pressures have not been tolerating the fluid decrease. MAPs are now running in the low 60s. Will increase fluids back up to 125cc in the but suspect he will need some  pressor support. Discussed with Renae Fickle from [facility] who has agreed to see. Cortisol is normal  Addendum #2 12:33 AM Pt seen at bedside by PCCM and myself. Blood pressure is normotensive at the time of evaluation so will hold off on further aggressive treatment for now. Will keep fluids going at 125cc/hr and monitor volume status closely.   Elige Radon, MD Internal Medicine Resident PGY-3 [facility] Pager: [phone] 03/14/2021 9:59 PM
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
## 2021-03-14 NOTE — Discharge Summary (Signed)
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 muy enfermo. Le trataron con antibiticos intravenosos y se someti a una ciruga durante este ingreso con [place]. Al recibir el alta, contine tomando los [employer] al da. Contine con el cuidado de la herida segn las instrucciones al momento del alta. Acuda a sus citas cuando le den de alta.  Gracias!   Mr Ruble, [place] were admitted to the hospital with a bite wound infection. This infection spread to your blood stream and caused you to be very sick. You were treated with IV antibiotics and underwent surgery during this admission with improvement. On discharge, please continue to take the antibiotics two times daily. Please continue wound care as instructed on discharge. Please go to your appointments on discharge.   Thank you!   Discharge wound care:   Complete by: As directed    Limpie y seque suavemente. Rellene los defectos con gasa en rollo humedecida con solucin  salina, cubra con gasa seca, almohadillas ABD y asegrelas con [place]. [employer] al da  Clean and gently pat dry. Fill defects with saline moistened roll gauze, top with dry gauze, ABD pads and secure with tape. Change twice daily   Increase activity slowly   Complete by: As directed        Signed: Eliezer Bottom, MD IMTS PGY-3 03/23/2021, 9:39 AM   Pager: [phone]

## 2021-03-15 DIAGNOSIS — J9600 Acute respiratory failure, unspecified whether with hypoxia or hypercapnia: Secondary | ICD-10-CM | POA: Diagnosis present

## 2021-03-15 DIAGNOSIS — W503XXA Accidental bite by another person, initial encounter: Secondary | ICD-10-CM

## 2021-03-15 LAB — BASIC METABOLIC PANEL
Anion gap: 8 (ref 5–15)
BUN: 26 mg/dL — ABNORMAL HIGH (ref 6–20)
CO2: 21 mmol/L — ABNORMAL LOW (ref 22–32)
Calcium: 7.7 mg/dL — ABNORMAL LOW (ref 8.9–10.3)
Chloride: 98 mmol/L (ref 98–111)
Creatinine, Ser: 1.04 mg/dL (ref 0.61–1.24)
GFR, Estimated: >60 mL/min (ref >60)
Glucose, Bld: 100 mg/dL — ABNORMAL HIGH (ref 70–99)
Potassium: 3.9 mmol/L (ref 3.5–5.1)
Sodium: 127 mmol/L — ABNORMAL LOW (ref 135–145)

## 2021-03-15 LAB — CULTURE, BLOOD (ROUTINE X 2): Special Requests: ADEQUATE

## 2021-03-15 LAB — CBC WITH DIFFERENTIAL/PLATELET
Abs Immature Granulocytes: 0.13 10*3/uL — ABNORMAL HIGH (ref 0.00–0.07)
Basophils Absolute: 0 10*3/uL (ref 0.0–0.1)
Basophils Relative: 0 %
Eosinophils Absolute: 0.3 10*3/uL (ref 0.0–0.5)
Eosinophils Relative: 3 %
HCT: 33.3 % — ABNORMAL LOW (ref 39.0–52.0)
Hemoglobin: 11.8 g/dL — ABNORMAL LOW (ref 13.0–17.0)
Immature Granulocytes: 1 %
Lymphocytes Relative: 1 %
Lymphs Abs: 0.1 10*3/uL — ABNORMAL LOW (ref 0.7–4.0)
MCH: 29.6 pg (ref 26.0–34.0)
MCHC: 35.4 g/dL (ref 30.0–36.0)
MCV: 83.7 fL (ref 80.0–100.0)
Monocytes Absolute: 0.2 10*3/uL (ref 0.1–1.0)
Monocytes Relative: 2 %
Neutro Abs: 9 10*3/uL — ABNORMAL HIGH (ref 1.7–7.7)
Neutrophils Relative %: 93 %
Platelets: 65 10*3/uL — ABNORMAL LOW (ref 150–400)
RBC: 3.98 MIL/uL — ABNORMAL LOW (ref 4.22–5.81)
RDW: 12.8 % (ref 11.5–15.5)
WBC: 9.7 10*3/uL (ref 4.0–10.5)
nRBC: 0 % (ref 0.0–0.2)

## 2021-03-15 LAB — CORTISOL: Cortisol, Plasma: 25 ug/dL

## 2021-03-15 LAB — HEPATIC FUNCTION PANEL
ALT: 46 U/L — ABNORMAL HIGH (ref 0–44)
AST: 47 U/L — ABNORMAL HIGH (ref 15–41)
Albumin: 1.5 g/dL — ABNORMAL LOW (ref 3.5–5.0)
Alkaline Phosphatase: 94 U/L (ref 38–126)
Bilirubin, Direct: 1.6 mg/dL — ABNORMAL HIGH (ref 0.0–0.2)
Indirect Bilirubin: 0.8 mg/dL (ref 0.3–0.9)
Total Bilirubin: 2.4 mg/dL — ABNORMAL HIGH (ref 0.3–1.2)
Total Protein: 4.9 g/dL — ABNORMAL LOW (ref 6.5–8.1)

## 2021-03-15 LAB — PROTIME-INR
INR: 1.4 — ABNORMAL HIGH (ref 0.8–1.2)
Prothrombin Time: 17.4 seconds — ABNORMAL HIGH (ref 11.4–15.2)

## 2021-03-15 LAB — APTT: aPTT: 30 seconds (ref 24–36)

## 2021-03-15 MED ORDER — HYDROMORPHONE HCL 1 MG/ML IJ SOLN
1.0000 mg | Freq: Once | INTRAMUSCULAR | Status: AC
Start: 2021-03-15 — End: 2021-03-15

## 2021-03-15 MED ORDER — RIVAROXABAN 10 MG PO TABS
10.0000 mg | ORAL_TABLET | Freq: Every day | ORAL | Status: DC
  Filled 2021-03-15: qty 1

## 2021-03-15 MED ORDER — HYDROCODONE-ACETAMINOPHEN 5-325 MG PO TABS
1.0000 | ORAL_TABLET | Freq: Two times a day (BID) | ORAL | Status: AC
  Administered 2021-03-15 – 2021-03-16 (×2): 1 via ORAL
  Filled 2021-03-15 (×2): qty 1

## 2021-03-15 MED ORDER — HYDROMORPHONE HCL 1 MG/ML IJ SOLN
INTRAMUSCULAR | Status: AC
  Filled 2021-03-15: qty 1

## 2021-03-15 NOTE — TOC CAGE-AID Note (Signed)
Transition of Care [facility]) - CAGE-AID Screening   Patient Details  Name: Bryce [name] MRN: [id number] Date of Birth: December 01, 1985  Transition of Care [facility]) CM/SW Contact:    Lossie Faes Tarpley-Carter, LCSWA Phone Number: 03/15/2021, 3:49 PM   Clinical Narrative: Pt participated in Cage-Aid.  Pt was offered resources.  Pt stated they did not feel that they were in need of resources at this time. Pt was provided resources on discharge summary for future use if needed.  Almond Fitzgibbon Tarpley-Carter, MSW, LCSW-A Pronouns:  She/Her/Hers                          [place] Clinical Social WorkerTransitions of Care Cell:  [phone] [email or web address]   CAGE-AID Screening:    Have You Ever Felt You Ought to Cut Down on Your Drinking or Drug Use?: No Have People Annoyed You By [employer] Your Drinking Or Drug Use?: No Have You Felt Bad Or Guilty About Your Drinking Or Drug Use?: No Have You Ever Had a Drink or Used Drugs First Thing In The Morning to Steady Your Nerves or to Get Rid of a Hangover?: No CAGE-AID Score: 0  Substance Abuse Education Offered: Yes  Substance abuse interventions: Transport planner

## 2021-03-15 NOTE — Progress Notes (Signed)
HD#2 Subjective:  Overnight Events: Elevated for potential escalation of care due to transient hypotension but deemed stable to remain on the floor.   Patient endorsing some pain around his abdomen but says that he doesn't think he needs any pain meds at this time. Feels comfortable filling his family in about his infection by himself at this time.   Objective:  Vital signs in last 24 hours: Vitals:   03/15/21 0400 03/15/21 0417 03/15/21 0425 03/15/21 0813  BP: (!) 86/58 90/63  (!) 86/59  Pulse:  (!) 106 96 97  Resp: 14 (!) 23 19 16   Temp:  97.7 F (36.5 C)  97.8 F (36.6 C)  TempSrc:  Oral  Oral  SpO2: 100% (!) 72% 100% 100%  Weight:      Height:       Supplemental O2: Nasal Cannula SpO2: 100 % O2 Flow Rate (L/min): 2 L/min   Physical Exam:  Constitutional: ill appearing man, lying in bed, in no acute distress Pulmonary/Chest: normal work of breathing on 2 L Abdominal: tender, induration of the lower abdomen with a 2 cm wound with a central eschar. Necrotizing lesion on the right lateral flank with surrounding fluid filled bullae that extend up to the ribcage. New fluid filled bullae with surrounding erythema on the left flank. Generalized edema of the trunk  Neurological: alert, answering questions appropriately   Filed Weights   03/13/21 0800  Weight: 59 kg     Intake/Output Summary (Last 24 hours) at 03/15/2021 1052 Last data filed at 03/15/2021 0623 Gross per 24 hour  Intake 1525.3 ml  Output 1700 ml  Net -174.7 ml   Net IO Since Admission: -999.7 mL [03/15/21 1052]  Pertinent Labs: CBC Latest Ref Rng & Units 03/15/2021 03/14/2021 03/14/2021  WBC 4.0 - 10.5 K/uL 9.7 6.4 -  Hemoglobin 13.0 - 17.0 g/dL 11.8(L) 12.7(L) -  Hematocrit 39.0 - 52.0 % 33.3(L) 36.1(L) -  Platelets 150 - 400 K/uL 65(L) 86(L) 93(L)    CMP Latest Ref Rng & Units 03/15/2021 03/14/2021 03/13/2021  Glucose 70 - 99 mg/dL 03/15/2021) 962(I) 95  BUN 6 - 20 mg/dL 297(L) 89(Q) 11(H)  Creatinine  0.61 - 1.24 mg/dL 41(D 4.08) 1.44(Y)  Sodium 135 - 145 mmol/L 127(L) 132(L) 133(L)  Potassium 3.5 - 5.1 mmol/L 3.9 3.7 3.7  Chloride 98 - 111 mmol/L 98 95(L) 98  CO2 22 - 32 mmol/L 21(L) 23 22  Calcium 8.9 - 10.3 mg/dL 7.7(L) 7.6(L) 7.4(L)  Total Protein 6.5 - 8.1 g/dL 4.9(L) - -  Total Bilirubin 0.3 - 1.2 mg/dL 2.4(H) - -  Alkaline Phos 38 - 126 U/L 94 - -  AST 15 - 41 U/L 47(H) - -  ALT 0 - 44 U/L 46(H) - -    Imaging: CT ABDOMEN PELVIS WO CONTRAST  Result Date: 03/14/2021 CLINICAL DATA:  35 year old male with abdominal pain and fever. EXAM: CT ABDOMEN AND PELVIS WITHOUT CONTRAST TECHNIQUE: Multidetector CT imaging of the abdomen and pelvis was performed following the standard protocol without IV contrast. COMPARISON:  CT of the chest abdomen pelvis dated 03/13/2021. FINDINGS: Evaluation of this exam is limited in the absence of intravenous contrast. Lower chest: Bibasilar patchy and streaky atelectasis. No intra-abdominal free air or free fluid. Hepatobiliary: No focal liver abnormality is seen. No gallstones, gallbladder wall thickening, or biliary dilatation. Pancreas: Unremarkable. No pancreatic ductal dilatation or surrounding inflammatory changes. Spleen: Normal in size without focal abnormality. Adrenals/Urinary Tract: The adrenal glands are unremarkable. The kidneys,  visualized ureters, and urinary bladder appear unremarkable. Stomach/Bowel: There is loose stool throughout the colon compatible with diarrheal state. Correlation with clinical exam and stool cultures recommended. There is no bowel obstruction or active inflammation. The appendix is normal. Vascular/Lymphatic: The abdominal aorta and IVC are grossly unremarkable on this noncontrast CT. No portal venous gas. There is no adenopathy. Reproductive: The prostate and seminal vesicles are grossly unremarkable. No pelvic mass. Other: There is diffuse subcutaneous edema and anasarca, worsened since the prior CT. No fluid collection.  Musculoskeletal: No acute or significant osseous findings. IMPRESSION: 1. Diarrheal state. Correlation with clinical exam and stool cultures recommended. No bowel obstruction. Normal appendix. 2. Diffuse subcutaneous edema and anasarca, worsened since the prior CT. Electronically Signed   By: Elgie Collard M.D.   On: 03/14/2021 19:22   DG CHEST PORT 1 VIEW  Result Date: 03/14/2021 CLINICAL DATA:  Acute respiratory failure. EXAM: PORTABLE CHEST 1 VIEW COMPARISON:  March 13, 2021 FINDINGS: The heart size and mediastinal contours are within normal limits. Low lung volumes with bibasilar atelectasis. No pleural effusion. No pneumothorax. The visualized skeletal structures are unchanged. IMPRESSION: Low lung volumes with bibasilar atelectasis. Electronically Signed   By: Maudry Mayhew MD   On: 03/14/2021 22:10    Assessment/Plan:   Principal Problem:   Sepsis (HCC) Active Problems:   Abdominal wall cellulitis   Acute renal failure (HCC)   Neutropenia (HCC)   Lactic acidosis   Group A Streptococcal Bacteremia   Acute respiratory failure (HCC)   Patient Summary: Bryce [name] is a 35 y.o. with  no known past medical history who presented with sepsis secondary to invasive group A strep.   #Sepsis secondary to invasive group A streptococcus bacteremia #AGMA, lactic acidosis: resolved Abdomen is now showing signs of necrotizing infection, surgery has been consulted in case wound requires surgical debridement. Increasing number of fluid filled bullae on the patients flanks bilaterally. Patient is still on the correct treatment of penicillin G and clindamycin, but continues to intermittently be hypotensive. Patient has been adequately resuscitated with fluid and is likely volume overloaded as he as now received about 12 L. Due to the overall severity of invasive group A strep infections there is a low threshold to escalate level of care. If he develops worsening hypotension he will likely  require pressor support and may require transfer to the ICU -ID and surgery have been consulted, appreciate their assistance -lactic acid downtrending -fluids 125 cc/hr  #Lymphopenia  #Thrombocytopenia: Ongoing #Neutropenia:resolved Patient's abs neutrophil count has recovered to 9 today, supporting the idea that this problem is related to the overall picture of sepsis -daily CBC  #Acute Renal Failure:resolved Creatinine has now normalized to 1.04 on 7/15 with treatment of sepsis  #Hyponatremia 127 on 7/15. Will likely continue to improve with treatment of underlying infection   Prior to Admission Living Arrangement: Home Anticipated Discharge Location: Home Barriers to Discharge: Continued medical workup Dispo: Anticipated discharge in 2-3 days   [name], MD Internal Medicine Resident PGY-1 Pager [phone] Please contact the on call pager after 5 pm and on weekends at [phone].
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
## 2021-03-15 NOTE — Progress Notes (Signed)
Paged MD on call with concerns for ongoing hypotension and tachycardia. Dr. Ephriam Knuckles called; discuss situation and will come to the bedside to assess patient. Will continue to monitor
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
## 2021-03-15 NOTE — Progress Notes (Addendum)
   03/15/21 2100  Assess: MEWS Score  Temp 98.2 F (36.8 C)  Pulse Rate (!) 116  Level of Consciousness Alert  SpO2 96 %  O2 Device Room Air  Assess: MEWS Score  MEWS Temp 0  MEWS Systolic 1  MEWS Pulse 2  MEWS RR 2  MEWS LOC 0  MEWS Score 5  MEWS Score Color Red  Assess: if the MEWS score is Yellow or Red  Were vital signs taken at a resting state? Yes  Focused Assessment No change from prior assessment  Early Detection of Sepsis Score *See Row Information* Low  MEWS guidelines implemented *See Row Information* Yes  Treat  MEWS Interventions Administered scheduled meds/treatments;Administered prn meds/treatments;Escalated (See documentation below)  Pain Scale 0-10  Pain Score 10  Pain Type Acute pain  Pain Location Abdomen  Pain Orientation Right;Left;Mid  Pain Radiating Towards Thighs  Pain Descriptors / Indicators Aching;Constant  Pain Frequency Constant  Pain Onset On-going  Patients Stated Pain Goal 3  Pain Intervention(s) Medication (See eMAR);MD notified (Comment)  Multiple Pain Sites No  Take Vital Signs  Increase Vital Sign Frequency  Red: Q 1hr X 4 then Q 4hr X 4, if remains red, continue Q 4hrs  Escalate  MEWS: Escalate Red: discuss with charge nurse/RN and provider, consider discussing with RRT  Notify: Charge Nurse/RN  Name of Charge Nurse/RN Notified Nikki RN  Date Charge Nurse/RN Notified 03/15/21  Time Charge Nurse/RN Notified 2100  Notify: Provider  Provider Name/Title Dr. Ephriam Knuckles and Dr. Sloan Leiter  Date Provider Notified 03/15/21  Time Provider Notified 2105  Notification Type Page  Notification Reason Change in status  Provider response At bedside  Date of Provider Response 03/15/21  Time of Provider Response 2120  Notify: Rapid Response  Name of Rapid Response RN Notified Mindy  Date Rapid Response Notified 03/15/21  Time Rapid Response Notified 2145   Physicians and Rapid Response both came to assess patient. Concern about hypotension and  tachycardia, increase in redden areas of abdomen, groin, axilla, and thigh areas bilat. Blisters remain on right and left flank/abdomen. Abdomen taut. Remains afebrile but now requires 2L oxygen to maintain a 96% saturation. Have initiated Red MEWS monitoring and documentation, however after discussion with Dr. Ephriam Knuckles we will check BP Q30 minutes.
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
## 2021-03-16 ENCOUNTER — Inpatient Hospital Stay (HOSPITAL_COMMUNITY): Payer: Self-pay | Admitting: Anesthesiology

## 2021-03-16 ENCOUNTER — Encounter (HOSPITAL_COMMUNITY): Payer: Self-pay | Admitting: Student in an Organized Health Care Education/Training Program

## 2021-03-16 ENCOUNTER — Encounter (HOSPITAL_COMMUNITY)
Admission: EM | Disposition: A | Discharge status: Home / Self Care | Payer: Self-pay | Source: Home / Self Care | Attending: Internal Medicine

## 2021-03-16 DIAGNOSIS — E8770 Fluid overload, unspecified: Secondary | ICD-10-CM | POA: Diagnosis not present

## 2021-03-16 DIAGNOSIS — E8809 Other disorders of plasma-protein metabolism, not elsewhere classified: Secondary | ICD-10-CM | POA: Diagnosis present

## 2021-03-16 HISTORY — PX: I & D EXTREMITY: SHX5045

## 2021-03-16 LAB — PROTIME-INR
INR: 1.3 — ABNORMAL HIGH (ref 0.8–1.2)
Prothrombin Time: 15.7 seconds — ABNORMAL HIGH (ref 11.4–15.2)

## 2021-03-16 LAB — COMPREHENSIVE METABOLIC PANEL
ALT: 38 U/L (ref 0–44)
AST: 43 U/L — ABNORMAL HIGH (ref 15–41)
Albumin: 1.4 g/dL — ABNORMAL LOW (ref 3.5–5.0)
Alkaline Phosphatase: 163 U/L — ABNORMAL HIGH (ref 38–126)
Anion gap: 8 (ref 5–15)
BUN: 16 mg/dL (ref 6–20)
CO2: 23 mmol/L (ref 22–32)
Calcium: 7.8 mg/dL — ABNORMAL LOW (ref 8.9–10.3)
Chloride: 98 mmol/L (ref 98–111)
Creatinine, Ser: 0.89 mg/dL (ref 0.61–1.24)
GFR, Estimated: >60 mL/min (ref >60)
Glucose, Bld: 110 mg/dL — ABNORMAL HIGH (ref 70–99)
Potassium: 3.8 mmol/L (ref 3.5–5.1)
Sodium: 129 mmol/L — ABNORMAL LOW (ref 135–145)
Total Bilirubin: 3 mg/dL — ABNORMAL HIGH (ref 0.3–1.2)
Total Protein: 4.5 g/dL — ABNORMAL LOW (ref 6.5–8.1)

## 2021-03-16 LAB — CBC
HCT: 33.9 % — ABNORMAL LOW (ref 39.0–52.0)
HCT: 34.4 % — ABNORMAL LOW (ref 39.0–52.0)
Hemoglobin: 12 g/dL — ABNORMAL LOW (ref 13.0–17.0)
Hemoglobin: 11.7 g/dL — ABNORMAL LOW (ref 13.0–17.0)
MCH: 29.8 pg (ref 26.0–34.0)
MCH: 29.3 pg (ref 26.0–34.0)
MCHC: 35.4 g/dL (ref 30.0–36.0)
MCHC: 34 g/dL (ref 30.0–36.0)
MCV: 84.1 fL (ref 80.0–100.0)
MCV: 86.2 fL (ref 80.0–100.0)
Platelets: 40 10*3/uL — ABNORMAL LOW (ref 150–400)
Platelets: 36 10*3/uL — ABNORMAL LOW (ref 150–400)
RBC: 4.03 MIL/uL — ABNORMAL LOW (ref 4.22–5.81)
RBC: 3.99 MIL/uL — ABNORMAL LOW (ref 4.22–5.81)
RDW: 13.2 % (ref 11.5–15.5)
RDW: 13.8 % (ref 11.5–15.5)
WBC: 14.8 10*3/uL — ABNORMAL HIGH (ref 4.0–10.5)
WBC: 22.1 10*3/uL — ABNORMAL HIGH (ref 4.0–10.5)
nRBC: 0.1 % (ref 0.0–0.2)
nRBC: 0 % (ref 0.0–0.2)

## 2021-03-16 SURGERY — IRRIGATION AND DEBRIDEMENT EXTREMITY
Anesthesia: General | Laterality: Bilateral

## 2021-03-16 MED ORDER — LACTATED RINGERS IV SOLN: INTRAVENOUS | Status: DC

## 2021-03-16 MED ORDER — LACTATED RINGERS IV BOLUS
1000.0000 mL | Freq: Once | INTRAVENOUS | Status: AC
  Administered 2021-03-16: 1000 mL via INTRAVENOUS

## 2021-03-16 MED ORDER — ALBUMIN HUMAN 25 % IV SOLN
12.5000 g | Freq: Once | INTRAVENOUS | Status: AC
  Administered 2021-03-16: 12.5 g via INTRAVENOUS
  Filled 2021-03-16: qty 50

## 2021-03-16 MED ORDER — DEXTROSE IN LACTATED RINGERS 5 % IV SOLN: INTRAVENOUS | Status: DC | PRN

## 2021-03-16 MED ORDER — SUCCINYLCHOLINE CHLORIDE 200 MG/10ML IV SOSY
PREFILLED_SYRINGE | INTRAVENOUS | Status: AC
  Filled 2021-03-16: qty 10

## 2021-03-16 MED ORDER — LIDOCAINE 2% (20 MG/ML) 5 ML SYRINGE
INTRAMUSCULAR | Status: DC | PRN
  Administered 2021-03-16: 40 mg via INTRAVENOUS

## 2021-03-16 MED ORDER — DEXAMETHASONE SODIUM PHOSPHATE 10 MG/ML IJ SOLN
INTRAMUSCULAR | Status: DC | PRN
  Administered 2021-03-16: 5 mg via INTRAVENOUS

## 2021-03-16 MED ORDER — MIDAZOLAM HCL 2 MG/2ML IJ SOLN
INTRAMUSCULAR | Status: DC | PRN
  Administered 2021-03-16: 2 mg via INTRAVENOUS

## 2021-03-16 MED ORDER — PHENYLEPHRINE 40 MCG/ML (10ML) SYRINGE FOR IV PUSH (FOR BLOOD PRESSURE SUPPORT)
PREFILLED_SYRINGE | INTRAVENOUS | Status: DC | PRN
  Administered 2021-03-16 (×4): 120 ug via INTRAVENOUS

## 2021-03-16 MED ORDER — DEXAMETHASONE SODIUM PHOSPHATE 10 MG/ML IJ SOLN
INTRAMUSCULAR | Status: AC
  Filled 2021-03-16: qty 1

## 2021-03-16 MED ORDER — PROPOFOL 10 MG/ML IV BOLUS
INTRAVENOUS | Status: AC
  Filled 2021-03-16: qty 20

## 2021-03-16 MED ORDER — AMISULPRIDE (ANTIEMETIC) 5 MG/2ML IV SOLN: 10.0000 mg | Freq: Once | INTRAVENOUS | Status: DC | PRN

## 2021-03-16 MED ORDER — ALBUMIN HUMAN 25 % IV SOLN: 12.5000 g | Freq: Four times a day (QID) | INTRAVENOUS | Status: DC

## 2021-03-16 MED ORDER — LACTATED RINGERS IV SOLN: INTRAVENOUS | Status: DC | PRN

## 2021-03-16 MED ORDER — FENTANYL CITRATE (PF) 100 MCG/2ML IJ SOLN
INTRAMUSCULAR | Status: AC
  Filled 2021-03-16: qty 2

## 2021-03-16 MED ORDER — FENTANYL CITRATE (PF) 250 MCG/5ML IJ SOLN
INTRAMUSCULAR | Status: AC
  Filled 2021-03-16: qty 5

## 2021-03-16 MED ORDER — ROCURONIUM BROMIDE 10 MG/ML (PF) SYRINGE
PREFILLED_SYRINGE | INTRAVENOUS | Status: AC
  Filled 2021-03-16: qty 10

## 2021-03-16 MED ORDER — ACETAMINOPHEN 325 MG PO TABS: 325.0000 mg | ORAL_TABLET | Freq: Once | ORAL | Status: DC | PRN

## 2021-03-16 MED ORDER — MIDAZOLAM HCL 2 MG/2ML IJ SOLN
INTRAMUSCULAR | Status: AC
  Filled 2021-03-16: qty 2

## 2021-03-16 MED ORDER — ACETAMINOPHEN 10 MG/ML IV SOLN: 1000.0000 mg | Freq: Once | INTRAVENOUS | Status: DC | PRN

## 2021-03-16 MED ORDER — ONDANSETRON HCL 4 MG/2ML IJ SOLN
INTRAMUSCULAR | Status: DC | PRN
  Administered 2021-03-16: 4 mg via INTRAVENOUS

## 2021-03-16 MED ORDER — HYDROMORPHONE HCL 1 MG/ML IJ SOLN
0.2500 mg | INTRAMUSCULAR | Status: DC | PRN
  Administered 2021-03-16: 0.25 mg via INTRAVENOUS

## 2021-03-16 MED ORDER — SUGAMMADEX SODIUM 200 MG/2ML IV SOLN
INTRAVENOUS | Status: DC | PRN
  Administered 2021-03-16: 300 mg via INTRAVENOUS

## 2021-03-16 MED ORDER — PROMETHAZINE HCL 25 MG/ML IJ SOLN: 6.2500 mg | INTRAMUSCULAR | Status: DC | PRN

## 2021-03-16 MED ORDER — FENTANYL CITRATE (PF) 250 MCG/5ML IJ SOLN
INTRAMUSCULAR | Status: DC | PRN
  Administered 2021-03-16: 100 ug via INTRAVENOUS

## 2021-03-16 MED ORDER — PHENYLEPHRINE HCL-NACL 10-0.9 MG/250ML-% IV SOLN
INTRAVENOUS | Status: DC | PRN
  Administered 2021-03-16: 35 ug/min via INTRAVENOUS

## 2021-03-16 MED ORDER — CHLORHEXIDINE GLUCONATE 0.12 % MT SOLN
OROMUCOSAL | Status: AC
  Administered 2021-03-16: 15 mL
  Filled 2021-03-16: qty 15

## 2021-03-16 MED ORDER — ROCURONIUM BROMIDE 10 MG/ML (PF) SYRINGE
PREFILLED_SYRINGE | INTRAVENOUS | Status: DC | PRN
  Administered 2021-03-16: 50 mg via INTRAVENOUS

## 2021-03-16 MED ORDER — HYDROMORPHONE HCL 1 MG/ML IJ SOLN
INTRAMUSCULAR | Status: AC
  Administered 2021-03-16: 0.25 mg via INTRAVENOUS
  Filled 2021-03-16: qty 1

## 2021-03-16 MED ORDER — PROPOFOL 10 MG/ML IV BOLUS
INTRAVENOUS | Status: DC | PRN
  Administered 2021-03-16: 140 mg via INTRAVENOUS

## 2021-03-16 MED ORDER — ONDANSETRON HCL 4 MG/2ML IJ SOLN
INTRAMUSCULAR | Status: AC
  Filled 2021-03-16: qty 2

## 2021-03-16 MED ORDER — SODIUM CHLORIDE 0.9% IV SOLUTION: Freq: Once | INTRAVENOUS | Status: AC

## 2021-03-16 MED ORDER — ACETAMINOPHEN 160 MG/5ML PO SOLN
325.0000 mg | Freq: Once | ORAL | Status: DC | PRN
Start: 2021-03-16 — End: 2021-03-16

## 2021-03-16 MED ORDER — LIDOCAINE 2% (20 MG/ML) 5 ML SYRINGE
INTRAMUSCULAR | Status: AC
  Filled 2021-03-16: qty 5

## 2021-03-16 MED ORDER — MEPERIDINE HCL 25 MG/ML IJ SOLN: 6.2500 mg | INTRAMUSCULAR | Status: DC | PRN

## 2021-03-16 MED ORDER — PHENYLEPHRINE 40 MCG/ML (10ML) SYRINGE FOR IV PUSH (FOR BLOOD PRESSURE SUPPORT)
PREFILLED_SYRINGE | INTRAVENOUS | Status: AC
  Filled 2021-03-16: qty 20

## 2021-03-16 MED ORDER — CHLORHEXIDINE GLUCONATE CLOTH 2 % EX PADS
6.0000 | MEDICATED_PAD | Freq: Every day | CUTANEOUS | Status: DC
  Administered 2021-03-17 – 2021-03-21 (×5): 6 via TOPICAL

## 2021-03-16 MED ORDER — 0.9 % SODIUM CHLORIDE (POUR BTL) OPTIME
TOPICAL | Status: DC | PRN
  Administered 2021-03-16: 1000 mL

## 2021-03-16 SURGICAL SUPPLY — 24 items
BAG COUNTER SPONGE SURGICOUNT (BAG) ×2 IMPLANT
BNDG GAUZE ELAST 4 BULKY (GAUZE/BANDAGES/DRESSINGS) IMPLANT
CANISTER SUCT 3000ML PPV (MISCELLANEOUS) ×2 IMPLANT
COVER SURGICAL LIGHT HANDLE (MISCELLANEOUS) ×2 IMPLANT
DRAPE IMP U-DRAPE 54X76 (DRAPES) IMPLANT
DRAPE LAPAROSCOPIC ABDOMINAL (DRAPES) IMPLANT
DRAPE LAPAROTOMY 100X72 PEDS (DRAPES) IMPLANT
DRSG PAD ABDOMINAL 8X10 ST (GAUZE/BANDAGES/DRESSINGS) ×2 IMPLANT
ELECT CAUTERY BLADE 6.4 (BLADE) ×2 IMPLANT
ELECT REM PT RETURN 9FT ADLT (ELECTROSURGICAL) ×2
ELECTRODE REM PT RTRN 9FT ADLT (ELECTROSURGICAL) ×1 IMPLANT
GAUZE SPONGE 4X4 12PLY STRL (GAUZE/BANDAGES/DRESSINGS) IMPLANT
GLOVE SURG ENC MOIS LTX SZ7 (GLOVE) ×2 IMPLANT
GLOVE SURG UNDER POLY LF SZ7.5 (GLOVE) ×2 IMPLANT
GOWN STRL REUS W/ TWL LRG LVL3 (GOWN DISPOSABLE) ×2 IMPLANT
GOWN STRL REUS W/TWL LRG LVL3 (GOWN DISPOSABLE) ×2
KIT BASIN OR (CUSTOM PROCEDURE TRAY) ×2 IMPLANT
KIT TURNOVER KIT B (KITS) ×2 IMPLANT
NS IRRIG 1000ML POUR BTL (IV SOLUTION) ×2 IMPLANT
PACK GENERAL/GYN (CUSTOM PROCEDURE TRAY) ×2 IMPLANT
PAD ARMBOARD 7.5X6 YLW CONV (MISCELLANEOUS) ×2 IMPLANT
PENCIL SMOKE EVACUATOR (MISCELLANEOUS) ×2 IMPLANT
TOWEL GREEN STERILE (TOWEL DISPOSABLE) ×2 IMPLANT
TOWEL GREEN STERILE FF (TOWEL DISPOSABLE) ×2 IMPLANT

## 2021-03-16 NOTE — Anesthesia Procedure Notes (Signed)
Procedure Name: Intubation Date/Time: 03/16/2021 12:21 PM Performed by: Rande Brunt, CRNA Pre-anesthesia Checklist: Patient identified, Emergency Drugs available, Suction available and Patient being monitored Patient Re-evaluated:Patient Re-evaluated prior to induction Oxygen Delivery Method: Circle System Utilized Preoxygenation: Pre-oxygenation with 100% oxygen Induction Type: IV induction Ventilation: Mask ventilation without difficulty Laryngoscope Size: Mac and 4 Grade View: Grade I Tube type: Oral Tube size: 7.5 mm Number of attempts: 1 Airway Equipment and Method: Stylet and Oral airway Placement Confirmation: ETT inserted through vocal cords under direct vision, positive ETCO2 and breath sounds checked- equal and bilateral Secured at: 22 cm Tube secured with: Tape Dental Injury: Teeth and Oropharynx as per pre-operative assessment

## 2021-03-16 NOTE — Transfer of Care (Signed)
Immediate Anesthesia Transfer of Care Note  Patient: Bryce [name]  Procedure(s) Performed: IRRIGATION AND DEBRIDEMENT FLANKS POSSIBLE ABDOMINAL WALL (Bilateral)  Patient Location: PACU  Anesthesia Type:General  Level of Consciousness: awake, drowsy and patient cooperative  Airway & Oxygen Therapy: Patient Spontanous Breathing and Patient connected to face mask oxygen  Post-op Assessment: Report given to RN, Post -op Vital signs reviewed and stable and Patient moving all extremities X 4  Post vital signs: Reviewed and stable  Last Vitals:  Vitals Value Taken Time  BP 126/92 03/16/21 1338  Temp 36.5 C 03/16/21 1338  Pulse 107 03/16/21 1339  Resp 24 03/16/21 1339  SpO2 98 % 03/16/21 1339  Vitals shown include unvalidated device data.  Last Pain:  Vitals:   03/16/21 1118  TempSrc: Oral  PainSc:       Patients Stated Pain Goal: 3 (03/15/21 2104)  Complications: No notable events documented.

## 2021-03-16 NOTE — Progress Notes (Signed)
HD#3 Subjective:  Overnight Events: Seen  by IMTS due to transient hypotension.  Patient says that he feels a bit better this morning, the pain medication he received last night helped. Denies any shortness of breath. He says his pain is worse during the day because he moves around more. He said that he continues to talk with his family several times a day. He agreed to ask them if they had any questions and would pass them on to the team. Still does not feel that the team needs to contact them directly at this time.   Objective:  Vital signs in last 24 hours: Vitals:   03/16/21 0200 03/16/21 0300 03/16/21 0400 03/16/21 0600  BP:  98/61 (!) 89/57   Pulse:  (!) 106 (!) 106 (!) 103  Resp:  15 14   Temp: 98 F (36.7 C) 97.8 F (36.6 C) 97.9 F (36.6 C) 97.8 F (36.6 C)  TempSrc: Oral Oral Oral Oral  SpO2: 99% 97% 96% 92%  Weight:   59.3 kg   Height:       Supplemental O2: Nasal Cannula SpO2: 92 % O2 Flow Rate (L/min): 2 L/min   Physical Exam:  Constitutional: ill appearing man, lying in bed, in no acute distress CV: regular rhythm, tachycardic Pulmonary/Chest: normal work of breathing on 2 L, lungs CTAB Abdominal: tender, induration of the lower abdomen with a 2 cm wound with a central eschar. Necrotizing lesion on the bilateral flanks with surrounding fluid filled bullae that extend up to the ribcage. Worse on the R than the L. Generalized edema of the trunk  Neurological: alert, answering questions appropriately  Filed Weights   03/13/21 0800 03/16/21 0400  Weight: 59 kg 59.3 kg     Intake/Output Summary (Last 24 hours) at 03/16/2021 0706 Last data filed at 03/16/2021 0400 Gross per 24 hour  Intake 1013.13 ml  Output 1600 ml  Net -586.87 ml   Net IO Since Admission: -1,586.57 mL [03/16/21 0706]  Pertinent Labs: CBC Latest Ref Rng & Units 03/16/2021 03/15/2021 03/14/2021  WBC 4.0 - 10.5 K/uL 14.8(H) 9.7 6.4  Hemoglobin 13.0 - 17.0 g/dL 12.0(L) 11.8(L) 12.7(L)   Hematocrit 39.0 - 52.0 % 33.9(L) 33.3(L) 36.1(L)  Platelets 150 - 400 K/uL 40(L) 65(L) 86(L)    CMP Latest Ref Rng & Units 03/16/2021 03/15/2021 03/14/2021  Glucose 70 - 99 mg/dL 048(G) 891(Q) 945(W)  BUN 6 - 20 mg/dL 16 38(U) 82(C)  Creatinine 0.61 - 1.24 mg/dL 0.03 4.91 7.91(T)  Sodium 135 - 145 mmol/L 129(L) 127(L) 132(L)  Potassium 3.5 - 5.1 mmol/L 3.8 3.9 3.7  Chloride 98 - 111 mmol/L 98 98 95(L)  CO2 22 - 32 mmol/L 23 21(L) 23  Calcium 8.9 - 10.3 mg/dL 7.8(L) 7.7(L) 7.6(L)  Total Protein 6.5 - 8.1 g/dL 0.5(W) 4.9(L) -  Total Bilirubin 0.3 - 1.2 mg/dL 3.0(H) 2.4(H) -  Alkaline Phos 38 - 126 U/L 163(H) 94 -  AST 15 - 41 U/L 43(H) 47(H) -  ALT 0 - 44 U/L 38 46(H) -    Imaging: No results found.  Assessment/Plan:   Principal Problem:   Sepsis (HCC) Active Problems:   Abdominal wall cellulitis   Acute renal failure (HCC)   Neutropenia (HCC)   Lactic acidosis   Group A Streptococcal Bacteremia   Acute respiratory failure (HCC)   Patient Summary: Bryce [name] is a 35 y.o. with  no known past medical history who presented with sepsis secondary to invasive group A strep.   #  Sepsis secondary to invasive group A streptococcus soft tissue infection c/b bacteremia #AGMA, lactic acidosis: resolved Evaluated by IMTS overnight due to intermittent hypotension. Abdomen shows signs of necrotizing infection. Increasing number of fluid filled bullae on the patients flanks bilaterally. Patient is still on the correct treatment of penicillin G and clindamycin, but continues to intermittently be hypotensive. Patient has been adequately resuscitated with fluid and is likely volume overloaded. Due to the overall severity of invasive group A strep infections there is a low threshold to escalate level of care. If he develops worsening hypotension he will likely require pressor support and may require transfer to the ICU -ID and surgery have been consulted, appreciate their  assistance -no surgical intervention at this time -fluids 125 cc/hr -vitals Q2   #Leukocytosis #Thrombocytopenia: Ongoing #Neutropenia:resolved White blood cell count elevated to 14.8 on 7/16, platelets 40. Still at risk for VTE due to inflammatory state and sepsis -continue xarelto 10 mg -daily CBC   #Acute Renal Failure:resolved Creatinine has now normalized to 1.04 on 7/15 with treatment of sepsis   #Hyponatremia Running in the high 120s, low 130s. Will likely continue to improve with treatment of underlying infection   Prior to Admission Living Arrangement: Home Anticipated Discharge Location: Home Barriers to Discharge: Continued medical management Dispo: Anticipated discharge to 2-3 days   [name], MD Internal Medicine Resident PGY-1 Pager [phone] Please contact the on call pager after 5 pm and on weekends at [phone].

## 2021-03-16 NOTE — Progress Notes (Signed)
OVERNIGHT COVERAGE CRITICAL CARE PROGRESS NOTE  CTSP re: hypotension, SBP 80s.  At the time of clinical interview, the patient is awake, alert, and on the phone with interpreter receiving an explanation for why he needs a Foley catheter inserted.  He is mentating well.  Afebrile.  Review of recent BPs shows MAP 64-74.  HR 105.  I see orders for 4 LR boluses and continuous infusion.  The patient is definitely visibly edematous/anasarcic, more so than when I saw him 2 nights ago (7/15).  Decreased breath sounds with crackles in the bases.  Albumin 1.5.  Patient also had incision and drainage and debridement of necrotic tissue along with debridement of both flanks earlier today.  Principal Problem:   Sepsis (HCC) Active Problems:   Volume overload   Hypoalbuminemia   Abdominal wall cellulitis   Acute renal failure (HCC)   Group A Streptococcal Bacteremia   Lactic acidosis   Acute respiratory failure (HCC)  25% Albumin 25 grams IV x 4 doses over the next 24 hours. Furosemide 40 mg IV 1 hour after 1st bottle of albumin. Monitor urine output and blood pressure response. Recommendations were communicated to Dr. Mcarthur Rossetti by phone.  Critical care time: 30 minutes.  The treatment and management of the patient's condition was required based on the threat of imminent deterioration. This time reflects time spent by the physician evaluating, providing care and managing the critically ill patient's care. The time was spent at the immediate bedside (or on the same floor/unit and dedicated to this patient's care). Time involved in separately billable procedures is NOT included int he critical care time indicated above. Family meeting and update time may be included above if and only if the patient is unable/incompetent to participate in clinical interview and/or decision making, and the discussion was necessary to determining treatment decisions.  Marcelle Smiling, MD Board Certified by the [employer], Pulmonary  Diseases & Critical Care Medicine

## 2021-03-16 NOTE — Progress Notes (Addendum)
OVERNIGHT ROUNDING NOTE:  Paged by RN regarding hypotension, tachycardia and worsening lethargy. Patient evaluated at bedside. He is awake, alert and oriented x4; he appears lethargic but is answering questions appropriately. He endorses pain at bilateral flanks and in upper thighs. He notes that this is about the same as earlier today and has not gotten significantly worse. RN noted that he had soaked through his abdominal dressing earlier. Otherwise denies any acute concerns at this time. Interpreter used during encounter.   Physical Exam: Blood pressure (!) 95/54, pulse (!) 105, temperature 98.4 F (36.9 C), temperature source Oral, resp. rate (!) 21, height 5\' 5"  (1.651 m), weight 59.3 kg, SpO2 93 %.  Constitutional: Acutely ill appearing middle aged male; diaphoretic  HENT: bruising noted around the left orbital; EOMI, MMM  Cardiovascular: tachycardic, regular rhythm, S1 and S2 present, no murmurs, rubs, gallops.  Distal pulses intact Respiratory: No respiratory distress, no accessory muscle use.  Effort is normal. Does have bibasilar crackles but saturating >90% on room air.  Musculoskeletal: Normal bulk and tone.  Diffuse anasarca with dependent edema Neurological: Is alert and oriented x4, no apparent focal deficits noted. Skin: Warm and diaphoretic. Abdominal dressing in place; L flank dressing appears to be saturating with blood   Assessment/Plan: GAS sepsis:  S/p I&D with surgery. Patient with persistent hypotension despite adequate fluid resuscitation with SBP in 80-90's. He remains tachycardic with HR 100-110s. He has been compensating well up to this point, likely due to his age; however, continues to become progressively volume overloaded. Albumin down to 1.4. Per RN at bedside, patient is more lethargic. He does appear lethargic on exam but is appropriately answering questions and endorses pain at bilateral flanks and bilateral thighs.Has had decreased urine output with only 550cc  UOP over past 24 hrs. Due to concern for clinical deterioration, PCCM called to evaluate. On their evaluation, patient did not require ICU level care as he is maintaining MAP>65 and is overall hemodynamically stable.  - Recommended to add 25% albumin 25g q6h for next 24 hours and possibly Lasix to augment diuresis for third spacing  - Recommended to decrease volume resuscitation  - Patient does have worsening leukocytosis; but this is likely reactive in setting of recent surgery; will continue to trend CBC - Will continue to trend MAPs  Thrombocytopenia:  Plt have been persistently decreasing and most recent Plt of 36. In light of recent surgery, would like to maintain >50K.  - Platelet transfusion  - F/u CBC

## 2021-03-16 NOTE — Progress Notes (Addendum)
Subjective: CC: Patient remains tachycardic. Soft BP overnight. Rapid response called. Still having pain to b/l sides. Spanish interpreter used.   Objective: Vital signs in last 24 hours: Temp:  [97.7 F (36.5 C)-98.9 F (37.2 C)] 98 F (36.7 C) (07/16 0759) Pulse Rate:  [103-120] 105 (07/16 0759) Resp:  [14-28] 24 (07/16 0759) BP: (85-100)/(53-64) 91/64 (07/16 0759) SpO2:  [92 %-99 %] 98 % (07/16 0759) Weight:  [59.3 kg] 59.3 kg (07/16 0400)    Intake/Output from previous day: 07/15 0701 - 07/16 0700 In: 2863.3 [I.V.:1638.1; IV Piggyback:1225.2] Out: 1600 [Urine:1600] Intake/Output this shift: No intake/output data recorded.  PE: Abd: see pictures below. Soft, tender on his flanks, bilateral.  He has a ~2.5x1.5cm wound just below his umbilicus with some dry eschar present.  No drainage from this wound.  He has significant edema and bullous/vesicular lesions all over bilateral flanks.  On his right flank the prior bullae has ruptured. His blanchable erythema, heat and induration now extends across lower abdomen below umbilicus to bilateral flanks extending to axilla.         Lab Results:  Recent Labs    03/15/21 0057 03/16/21 0122  WBC 9.7 14.8*  HGB 11.8* 12.0*  HCT 33.3* 33.9*  PLT 65* 40*   BMET Recent Labs    03/15/21 0057 03/16/21 0122  NA 127* 129*  K 3.9 3.8  CL 98 98  CO2 21* 23  GLUCOSE 100* 110*  BUN 26* 16  CREATININE 1.04 0.89  CALCIUM 7.7* 7.8*   PT/INR Recent Labs    03/15/21 0607 03/16/21 0122  LABPROT 17.4* 15.7*  INR 1.4* 1.3*   CMP     Component Value Date/Time   NA 129 (L) 03/16/2021 0122   K 3.8 03/16/2021 0122   CL 98 03/16/2021 0122   CO2 23 03/16/2021 0122   GLUCOSE 110 (H) 03/16/2021 0122   BUN 16 03/16/2021 0122   CREATININE 0.89 03/16/2021 0122   CALCIUM 7.8 (L) 03/16/2021 0122   PROT 4.5 (L) 03/16/2021 0122   ALBUMIN 1.4 (L) 03/16/2021 0122   AST 43 (H) 03/16/2021 0122   ALT 38 03/16/2021 0122    ALKPHOS 163 (H) 03/16/2021 0122   BILITOT 3.0 (H) 03/16/2021 0122   GFRNONAA >60 03/16/2021 0122   Lipase     Component Value Date/Time   LIPASE 18 03/13/2021 0724       Studies/Results: CT ABDOMEN PELVIS WO CONTRAST  Result Date: 03/14/2021 CLINICAL DATA:  35 year old male with abdominal pain and fever. EXAM: CT ABDOMEN AND PELVIS WITHOUT CONTRAST TECHNIQUE: Multidetector CT imaging of the abdomen and pelvis was performed following the standard protocol without IV contrast. COMPARISON:  CT of the chest abdomen pelvis dated 03/13/2021. FINDINGS: Evaluation of this exam is limited in the absence of intravenous contrast. Lower chest: Bibasilar patchy and streaky atelectasis. No intra-abdominal free air or free fluid. Hepatobiliary: No focal liver abnormality is seen. No gallstones, gallbladder wall thickening, or biliary dilatation. Pancreas: Unremarkable. No pancreatic ductal dilatation or surrounding inflammatory changes. Spleen: Normal in size without focal abnormality. Adrenals/Urinary Tract: The adrenal glands are unremarkable. The kidneys, visualized ureters, and urinary bladder appear unremarkable. Stomach/Bowel: There is loose stool throughout the colon compatible with diarrheal state. Correlation with clinical exam and stool cultures recommended. There is no bowel obstruction or active inflammation. The appendix is normal. Vascular/Lymphatic: The abdominal aorta and IVC are grossly unremarkable on this noncontrast CT. No portal venous gas. There is no adenopathy. Reproductive:  The prostate and seminal vesicles are grossly unremarkable. No pelvic mass. Other: There is diffuse subcutaneous edema and anasarca, worsened since the prior CT. No fluid collection. Musculoskeletal: No acute or significant osseous findings. IMPRESSION: 1. Diarrheal state. Correlation with clinical exam and stool cultures recommended. No bowel obstruction. Normal appendix. 2. Diffuse subcutaneous edema and anasarca,  worsened since the prior CT. Electronically Signed   By: Elgie Collard M.D.   On: 03/14/2021 19:22   DG CHEST PORT 1 VIEW  Result Date: 03/14/2021 CLINICAL DATA:  Acute respiratory failure. EXAM: PORTABLE CHEST 1 VIEW COMPARISON:  March 13, 2021 FINDINGS: The heart size and mediastinal contours are within normal limits. Low lung volumes with bibasilar atelectasis. No pleural effusion. No pneumothorax. The visualized skeletal structures are unchanged. IMPRESSION: Low lung volumes with bibasilar atelectasis. Electronically Signed   By: Maudry Mayhew MD   On: 03/14/2021 22:10    Anti-infectives: Anti-infectives (From admission, onward)    Start     Dose/Rate Route Frequency Ordered Stop   03/14/21 1600  penicillin G potassium 12 Million Units in dextrose 5 % 500 mL continuous infusion        12 Million Units 41.7 mL/hr over 12 Hours Intravenous Every 12 hours 03/14/21 1453     03/14/21 1200  penicillin G potassium 4 Million Units in dextrose 5 % 250 mL IVPB  Status:  Discontinued        4 Million Units 250 mL/hr over 60 Minutes Intravenous Every 4 hours 03/14/21 0901 03/14/21 1453   03/13/21 2330  clindamycin (CLEOCIN) IVPB 600 mg        600 mg 100 mL/hr over 30 Minutes Intravenous Every 8 hours 03/13/21 2238 03/16/21 2359   03/13/21 2315  penicillin G potassium 4 Million Units in dextrose 5 % 250 mL IVPB  Status:  Discontinued        4 Million Units 250 mL/hr over 60 Minutes Intravenous Every 6 hours 03/13/21 2227 03/13/21 2231   03/13/21 2315  penicillin G potassium 4 Million Units in dextrose 5 % 250 mL IVPB  Status:  Discontinued        4 Million Units 250 mL/hr over 60 Minutes Intravenous Every 6 hours 03/13/21 2238 03/14/21 0901   03/13/21 0845  vancomycin variable dose per unstable renal function (pharmacist dosing)  Status:  Discontinued         Does not apply See admin instructions 03/13/21 0845 03/13/21 2242   03/13/21 0845  vancomycin (VANCOREADY) IVPB 1250 mg/250 mL         1,250 mg 166.7 mL/hr over 90 Minutes Intravenous  Once 03/13/21 0843 03/13/21 1240   03/13/21 0830  cefTRIAXone (ROCEPHIN) 2 g in sodium chloride 0.9 % 100 mL IVPB        2 g 200 mL/hr over 30 Minutes Intravenous  Once 03/13/21 0820 03/13/21 1002        Assessment/Plan Sepsis secondary to S. Pyogenes bacteremia Abdominal wall/flank cellulitis - CT on 7/14 with diffuse subcutaneous edema and anasarca. Patient remains tachycardic with soft BP. WBC up at 14. Cellulitis appears to be worsening. Reviewed pictures with MD. Will plan for OR today for incision, drainage and debridement of bilateral flanks with possible debridement of abdominal wall. I discussed with the patient using spanish interpreter. We discussed indications, planned procedure and risks. Risks include but are not limited to general anesthesia (MI, CVA, death), bleeding, scarring, infection, and need for additional surgeries. Patient seems to understand and is agreeable to plan. Informed  consent filled out in both spanish and english. I have reached out to primary team to let them know.     FEN - NPO, IVF at 167ml/hr  VTE - SCDs ID - Pen G/Clina (per ID)   LOS: 3 days    Jacinto Halim , [facility] 03/16/2021, 9:17 AM Please see Amion for pager number during day hours 7:00am-4:30pm

## 2021-03-16 NOTE — Anesthesia Preprocedure Evaluation (Addendum)
Anesthesia Evaluation  Patient identified by MRN, date of birth, ID band Patient awake    Reviewed: Allergy & Precautions, NPO status , Patient's Chart, lab work & pertinent test results  Airway Mallampati: III  TM Distance: >3 FB Neck ROM: Full    Dental  (+) Teeth Intact, Dental Advisory Given   Pulmonary former smoker,    breath sounds clear to auscultation       Cardiovascular negative cardio ROS   Rhythm:Regular Rate:Normal     Neuro/Psych negative neurological ROS  negative psych ROS   GI/Hepatic negative GI ROS, Neg liver ROS,   Endo/Other  negative endocrine ROS  Renal/GU      Musculoskeletal negative musculoskeletal ROS (+)   Abdominal Normal abdominal exam  (+)   Peds  Hematology negative hematology ROS (+)   Anesthesia Other Findings   Reproductive/Obstetrics                            Anesthesia Physical Anesthesia Plan  ASA: 2  Anesthesia Plan: General   Post-op Pain Management:    Induction: Intravenous  PONV Risk Score and Plan: 3 and Ondansetron, Dexamethasone and Midazolam  Airway Management Planned: Oral ETT  Additional Equipment: None  Intra-op Plan:   Post-operative Plan: Extubation in OR  Informed Consent: I have reviewed the patients History and Physical, chart, labs and discussed the procedure including the risks, benefits and alternatives for the proposed anesthesia with the patient or authorized representative who has indicated his/her understanding and acceptance.     Interpreter used for [employer] Discussed with: CRNA  Anesthesia Plan Comments:       Anesthesia Quick Evaluation

## 2021-03-16 NOTE — Progress Notes (Signed)
Pt has been continuously declining despite interventions.  Dr. Mcarthur Rossetti aware and critical care involved.   03/16/21 2115  Assess: MEWS Score  BP 104/65  Pulse Rate (!) 108  ECG Heart Rate (!) 109  Resp (!) 21  Level of Consciousness Alert  SpO2 96 %  Assess: MEWS Score  MEWS Temp 0  MEWS Systolic 0  MEWS Pulse 1  MEWS RR 1  MEWS LOC 0  MEWS Score 2  MEWS Score Color Yellow  Assess: if the MEWS score is Yellow or Red  Were vital signs taken at a resting state? Yes  Focused Assessment Change from prior assessment (see assessment flowsheet)  Early Detection of Sepsis Score *See Row Information* Medium  MEWS guidelines implemented *See Row Information* Yes  Treat  Pain Scale 0-10  Pain Score 5  Pain Type Acute pain  Pain Location Abdomen  Pain Orientation Right;Left;Mid  Pain Descriptors / Indicators Aching;Constant  Pain Frequency Constant  Pain Onset On-going  Patients Stated Pain Goal 3  Pain Intervention(s) Medication (See eMAR)  Take Vital Signs  Increase Vital Sign Frequency  Red: Q 1hr X 4 then Q 4hr X 4, if remains red, continue Q 4hrs  Escalate  MEWS: Escalate Red: discuss with charge nurse/RN and provider, consider discussing with RRT  Notify: Charge Nurse/RN  Name of Charge Nurse/RN Notified Nikki, RN  Date Charge Nurse/RN Notified 03/16/21  Time Charge Nurse/RN Notified 2100  Notify: Provider  Provider Name/Title Dr. Mcarthur Rossetti  Date Provider Notified 03/16/21  Time Provider Notified 2100  Notification Type Call  Notification Reason Change in status  Provider response At bedside;See new orders  Date of Provider Response 03/16/21  Time of Provider Response 2200  Notify: Rapid Response  Name of Rapid Response RN Notified Mindy  Date Rapid Response Notified 03/16/21  Time Rapid Response Notified 2100  Document  Patient Outcome Not stable and remains on department  Progress note created (see row info) Yes
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
## 2021-03-16 NOTE — Significant Event (Signed)
Rapid Response Event Note   Reason for Call :  Second set of eyes for pt with progressive skin rash, tachycardia, and hypotension  Initial Focused Assessment:  Pt lying in bed with eyes open. He speaks very little English, however, appears to be alert and oriented, will follow simple commands and is moving all extremities. Lungs clear t/o. Skin warm to touch. He has rash/blisters/redness on his flank/abd/thigh areas. Per nursing, this has progressed since the beginning of their shift.  T-98.6, HR-122, BP-84/63(repeat at 2300-100/58), RR-22, SpO2-98% on 2L    Interventions:  No RRT interventions needed at this time  Plan of Care:  IMTS MD coming to bedside to evaluate pt. PCCM is already onboard in case of decline. Increase VS freq to q71m. Continue to monitor closely. Call RRT if further assistance needed.    Event Summary:   MD Notified: Notified by bedside RN PTA RRT Call [phone] Arrival Time:2230 End Time:2300  Terrilyn Saver, RN
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
## 2021-03-17 ENCOUNTER — Encounter (HOSPITAL_COMMUNITY): Payer: Self-pay | Admitting: General Surgery

## 2021-03-17 DIAGNOSIS — E8809 Other disorders of plasma-protein metabolism, not elsewhere classified: Secondary | ICD-10-CM

## 2021-03-17 DIAGNOSIS — M79652 Pain in left thigh: Secondary | ICD-10-CM

## 2021-03-17 DIAGNOSIS — D708 Other neutropenia: Secondary | ICD-10-CM

## 2021-03-17 DIAGNOSIS — M79651 Pain in right thigh: Secondary | ICD-10-CM

## 2021-03-17 DIAGNOSIS — D65 Disseminated intravascular coagulation [defibrination syndrome]: Secondary | ICD-10-CM

## 2021-03-17 LAB — COMPREHENSIVE METABOLIC PANEL
ALT: 29 U/L (ref 0–44)
AST: 30 U/L (ref 15–41)
Albumin: 1.5 g/dL — ABNORMAL LOW (ref 3.5–5.0)
Alkaline Phosphatase: 159 U/L — ABNORMAL HIGH (ref 38–126)
Anion gap: 7 (ref 5–15)
BUN: 12 mg/dL (ref 6–20)
CO2: 26 mmol/L (ref 22–32)
Calcium: 7.6 mg/dL — ABNORMAL LOW (ref 8.9–10.3)
Chloride: 94 mmol/L — ABNORMAL LOW (ref 98–111)
Creatinine, Ser: 0.88 mg/dL (ref 0.61–1.24)
GFR, Estimated: >60 mL/min (ref >60)
Glucose, Bld: 204 mg/dL — ABNORMAL HIGH (ref 70–99)
Potassium: 3.8 mmol/L (ref 3.5–5.1)
Sodium: 127 mmol/L — ABNORMAL LOW (ref 135–145)
Total Bilirubin: 2.1 mg/dL — ABNORMAL HIGH (ref 0.3–1.2)
Total Protein: 4.4 g/dL — ABNORMAL LOW (ref 6.5–8.1)

## 2021-03-17 LAB — LACTATE DEHYDROGENASE: LDH: 145 U/L (ref 98–192)

## 2021-03-17 LAB — TYPE AND SCREEN
ABO/RH(D): O POS
Antibody Screen: NEGATIVE

## 2021-03-17 LAB — DIC (DISSEMINATED INTRAVASCULAR COAGULATION)PANEL
D-Dimer, Quant: 5.4 ug/mL-FEU — ABNORMAL HIGH (ref 0.00–0.50)
Fibrinogen: >800 mg/dL — ABNORMAL HIGH (ref 210–475)
INR: 1.2 (ref 0.8–1.2)
Platelets: 56 10*3/uL — ABNORMAL LOW (ref 150–400)
Prothrombin Time: 15.2 seconds (ref 11.4–15.2)
Smear Review: NONE SEEN
aPTT: 25 seconds (ref 24–36)

## 2021-03-17 LAB — CBC
HCT: 30.7 % — ABNORMAL LOW (ref 39.0–52.0)
HCT: 30.2 % — ABNORMAL LOW (ref 39.0–52.0)
Hemoglobin: 10.6 g/dL — ABNORMAL LOW (ref 13.0–17.0)
Hemoglobin: 10.5 g/dL — ABNORMAL LOW (ref 13.0–17.0)
MCH: 29 pg (ref 26.0–34.0)
MCH: 29.4 pg (ref 26.0–34.0)
MCHC: 34.5 g/dL (ref 30.0–36.0)
MCHC: 34.8 g/dL (ref 30.0–36.0)
MCV: 84.1 fL (ref 80.0–100.0)
MCV: 84.6 fL (ref 80.0–100.0)
Platelets: 37 10*3/uL — ABNORMAL LOW (ref 150–400)
Platelets: 50 10*3/uL — ABNORMAL LOW (ref 150–400)
RBC: 3.65 MIL/uL — ABNORMAL LOW (ref 4.22–5.81)
RBC: 3.57 MIL/uL — ABNORMAL LOW (ref 4.22–5.81)
RDW: 13.6 % (ref 11.5–15.5)
RDW: 13.6 % (ref 11.5–15.5)
WBC: 22.7 10*3/uL — ABNORMAL HIGH (ref 4.0–10.5)
WBC: 26.1 10*3/uL — ABNORMAL HIGH (ref 4.0–10.5)
nRBC: 0.1 % (ref 0.0–0.2)
nRBC: 0 % (ref 0.0–0.2)

## 2021-03-17 LAB — TECHNOLOGIST SMEAR REVIEW

## 2021-03-17 LAB — PROTIME-INR
INR: 1.2 (ref 0.8–1.2)
INR: 1.2 (ref 0.8–1.2)
Prothrombin Time: 15.2 seconds (ref 11.4–15.2)
Prothrombin Time: 15.4 s — ABNORMAL HIGH (ref 11.4–15.2)

## 2021-03-17 LAB — ABO/RH: ABO/RH(D): O POS

## 2021-03-17 LAB — LACTIC ACID, PLASMA
Lactic Acid, Venous: 2.4 mmol/L (ref 0.5–1.9)
Lactic Acid, Venous: 1.5 mmol/L (ref 0.5–1.9)
Lactic Acid, Venous: 1.7 mmol/L (ref 0.5–1.9)

## 2021-03-17 LAB — PROCALCITONIN: Procalcitonin: 9.16 ng/mL

## 2021-03-17 LAB — APTT: aPTT: 27 s (ref 24–36)

## 2021-03-17 MED ORDER — CLINDAMYCIN PHOSPHATE 600 MG/50ML IV SOLN
600.0000 mg | Freq: Three times a day (TID) | INTRAVENOUS | Status: DC
  Administered 2021-03-17 – 2021-03-18 (×4): 600 mg via INTRAVENOUS
  Filled 2021-03-17 (×4): qty 50

## 2021-03-17 MED ORDER — FUROSEMIDE 10 MG/ML IJ SOLN
40.0000 mg | Freq: Once | INTRAMUSCULAR | Status: AC
  Administered 2021-03-17: 40 mg via INTRAVENOUS
  Filled 2021-03-17: qty 4

## 2021-03-17 MED ORDER — ALBUMIN HUMAN 25 % IV SOLN
25.0000 g | Freq: Four times a day (QID) | INTRAVENOUS | Status: DC
  Administered 2021-03-17: 25 g via INTRAVENOUS
  Filled 2021-03-17: qty 100

## 2021-03-17 MED ORDER — SODIUM CHLORIDE 0.9 % IV SOLN
3.0000 g | Freq: Four times a day (QID) | INTRAVENOUS | Status: DC
  Administered 2021-03-17 – 2021-03-21 (×17): 3 g via INTRAVENOUS
  Filled 2021-03-17: qty 8
  Filled 2021-03-17 (×2): qty 3
  Filled 2021-03-17 (×5): qty 8
  Filled 2021-03-17: qty 3
  Filled 2021-03-17 (×6): qty 8
  Filled 2021-03-17 (×2): qty 3
  Filled 2021-03-17: qty 8
  Filled 2021-03-17: qty 3
  Filled 2021-03-17 (×2): qty 8

## 2021-03-17 NOTE — Progress Notes (Signed)
Video Spanish interpreter used for full physical assessment and assessment of any needs. Rates pain 10/10 abdomen mid, left and right areas. Dressings C/D/I. Denies nausea or any other pain. States he feels congestion in his throat. No other needs at this time. Pain med given per [facility] see order.
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
## 2021-03-17 NOTE — Progress Notes (Addendum)
Pharmacy Antibiotic Note  Bryce [name] is a 35 y.o. male admitted on 03/13/2021 with sepsis.  Pharmacy has been consulted for vancomycin dosing. Patient's lactic acid remains elevated at 2.4, WBC elevated at 22.7, and patient is afebrile. Patient underwent I&D on 7/16. The team has decided to stop penicillin G, start Unasyn, and continue clindamycin.   Plan: Stop penicillin G continuous infusion. Start Unasyn 3g q6h. Continue clindamycin 600mg  q8h.   Height: 5\' 5"  (165.1 cm) Weight: 59.3 kg (130 lb 11.7 oz) IBW/kg (Calculated) : 61.5  Temp (24hrs), Avg:98.2 F (36.8 C), Min:97.7 F (36.5 C), Max:99.4 F (37.4 C)  Recent Labs  Lab 03/13/21 1022 03/13/21 1053 03/13/21 1658 03/13/21 1948 03/14/21 0050 03/14/21 1140 03/14/21 1658 03/15/21 0057 03/16/21 0122 03/16/21 2128 03/17/21 0033  WBC  --    < >  --   --   --  6.4  --  9.7 14.8* 22.1* 22.7*  CREATININE  --   --  2.13* 1.91* 1.55*  --   --  1.04 0.89  --  0.88  LATICACIDVEN 3.3*  --  3.0* 2.7*  --   --  2.0*  --   --   --  2.4*   < > = values in this interval not displayed.     Estimated Creatinine Clearance: 97.3 mL/min (by C-G formula based on SCr of 0.88 mg/dL).  Renal function is stable.   No Known Allergies  Antimicrobials this admission: Unasyn 7/17>> Clindamycin 7/14>> IV PenG 7/13>> Vanc 7/13 >>7/13 CTX 7/13 x1  Microbiology results:  Bcx 7/13 > S. Pyogenes 2/4 bottles. Pan sens Ucx 7/13 >ngF 7/14 blood>>ngtd 7/16 tissue cx > ngtd   Thank you for allowing pharmacy to be a part of this patient's care.  8/14, PharmD Pharmacy Resident 03/17/2021, 7:29 AM
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
## 2021-03-17 NOTE — Anesthesia Postprocedure Evaluation (Signed)
Anesthesia Post Note  Patient: Bryce [name]  Procedure(s) Performed: IRRIGATION AND DEBRIDEMENT FLANKS POSSIBLE ABDOMINAL WALL (Bilateral)     Patient location during evaluation: PACU Anesthesia Type: General Level of consciousness: awake and alert Pain management: pain level controlled Vital Signs Assessment: post-procedure vital signs reviewed and stable Respiratory status: spontaneous breathing, nonlabored ventilation, respiratory function stable and patient connected to nasal cannula oxygen Cardiovascular status: blood pressure returned to baseline and stable Postop Assessment: no apparent nausea or vomiting Anesthetic complications: no Comments: Bout of emesis after extubation. Stable in PACU from respiratory standpoint.   No notable events documented.               Shelton Silvas

## 2021-03-17 NOTE — Progress Notes (Signed)
1 Day Post-Op  Subjective: CC: Overnight notes reviewed. Continues to get resuscitated with multiple LR boluses. Got albumin last night. Foley placed for I/O monitoring. CCM following. ID following. BP and HR slightly improved. Complains of pain over incisions. Also having pain in bilateral thighs   Objective: Vital signs in last 24 hours: Temp:  [97.7 F (36.5 C)-99.4 F (37.4 C)] 98.7 F (37.1 C) (07/17 0423) Pulse Rate:  [93-112] 94 (07/17 0423) Resp:  [11-34] 17 (07/17 0423) BP: (84-134)/(54-92) 96/64 (07/17 0423) SpO2:  [90 %-98 %] 94 % (07/17 0423)    Intake/Output from previous day: 07/16 0701 - 07/17 0700 In: 3954.8 [P.O.:480; I.V.:1114.2; Blood:392.5; IV Piggyback:1968.1] Out: 1075 [Urine:1075] Intake/Output this shift: Total I/O In: -  Out: 200 [Urine:200]  PE: Gen Lungs: Normal rate and effort  Abd: see pictures below. Abd soft and tender over incisions appropriately. Infraumbilical incision with healthy granulation tissue at the base. Does not track/undermine. No drainage. Erythema surrounding improved. Right flank wound x 2 with healthy granulation tissue at the base. Superior incision tracks 6cm medially. No drainage. Improved induration and erythema from yesterday.  Lft flank wound with healthy granulation tissue at the base. No tracking/undermining. No drainage. Improved induration and erythema from yesterday.    Infraumbilical    Right side     Left side     Lab Results:  Recent Labs    03/17/21 0033 03/17/21 0603  WBC 22.7* 26.1*  HGB 10.6* 10.5*  HCT 30.7* 30.2*  PLT 37* 50*   BMET Recent Labs    03/16/21 0122 03/17/21 0033  NA 129* 127*  K 3.8 3.8  CL 98 94*  CO2 23 26  GLUCOSE 110* 204*  BUN 16 12  CREATININE 0.89 0.88  CALCIUM 7.8* 7.6*   PT/INR Recent Labs    03/17/21 0033 03/17/21 0603  LABPROT 15.2 15.4*  INR 1.2 1.2   CMP     Component Value Date/Time   NA 127 (L) 03/17/2021 0033   K 3.8 03/17/2021 0033    CL 94 (L) 03/17/2021 0033   CO2 26 03/17/2021 0033   GLUCOSE 204 (H) 03/17/2021 0033   BUN 12 03/17/2021 0033   CREATININE 0.88 03/17/2021 0033   CALCIUM 7.6 (L) 03/17/2021 0033   PROT 4.4 (L) 03/17/2021 0033   ALBUMIN 1.5 (L) 03/17/2021 0033   AST 30 03/17/2021 0033   ALT 29 03/17/2021 0033   ALKPHOS 159 (H) 03/17/2021 0033   BILITOT 2.1 (H) 03/17/2021 0033   GFRNONAA >60 03/17/2021 0033   Lipase     Component Value Date/Time   LIPASE 18 03/13/2021 0724       Studies/Results: No results found.  Anti-infectives: Anti-infectives (From admission, onward)    Start     Dose/Rate Route Frequency Ordered Stop   03/17/21 0900  clindamycin (CLEOCIN) IVPB 600 mg        600 mg 100 mL/hr over 30 Minutes Intravenous Every 8 hours 03/17/21 0759     03/14/21 1600  penicillin G potassium 12 Million Units in dextrose 5 % 500 mL continuous infusion  Status:  Discontinued        12 Million Units 41.7 mL/hr over 12 Hours Intravenous Every 12 hours 03/14/21 1453 03/17/21 0747   03/14/21 1200  penicillin G potassium 4 Million Units in dextrose 5 % 250 mL IVPB  Status:  Discontinued        4 Million Units 250 mL/hr over 60 Minutes Intravenous Every 4 hours 03/14/21  0901 03/14/21 1453   03/13/21 2330  clindamycin (CLEOCIN) IVPB 600 mg        600 mg 100 mL/hr over 30 Minutes Intravenous Every 8 hours 03/13/21 2238 03/16/21 2303   03/13/21 2315  penicillin G potassium 4 Million Units in dextrose 5 % 250 mL IVPB  Status:  Discontinued        4 Million Units 250 mL/hr over 60 Minutes Intravenous Every 6 hours 03/13/21 2227 03/13/21 2231   03/13/21 2315  penicillin G potassium 4 Million Units in dextrose 5 % 250 mL IVPB  Status:  Discontinued        4 Million Units 250 mL/hr over 60 Minutes Intravenous Every 6 hours 03/13/21 2238 03/14/21 0901   03/13/21 0845  vancomycin variable dose per unstable renal function (pharmacist dosing)  Status:  Discontinued         Does not apply See admin  instructions 03/13/21 0845 03/13/21 2242   03/13/21 0845  vancomycin (VANCOREADY) IVPB 1250 mg/250 mL        1,250 mg 166.7 mL/hr over 90 Minutes Intravenous  Once 03/13/21 0843 03/13/21 1240   03/13/21 0830  cefTRIAXone (ROCEPHIN) 2 g in sodium chloride 0.9 % 100 mL IVPB        2 g 200 mL/hr over 30 Minutes Intravenous  Once 03/13/21 0820 03/13/21 1002        Assessment/Plan POD 1 s/p I&D followed by debridement of 5x5x3 cm area of necrotic tissue inferior to umbilicus at site of bite wound, Incision and debridement times two of right flank bullae/? (5x3x2 and 4x3x2 cm), Incision and debridement of left flank bullae/? (5x2x2 cm) for infraumbilical abscess, bilateral flank anasarca - Dr. Dwain Sarna - 03/16/21 - No further surgical debridement indicated - BID WTD - Abx per ID. CX's pending - Appreciate Primary, ID's and CCM assistance   FEN - Reg VTE - SCDs, per primary ID - Unasyn/Clinda Foley - per primary  Group A strep bacteremia  Elevated LFTs Thrombocytopenia - plt transfusion yesterday. DIC panel pending. PLT 50 ABL anemia - hgb stable    LOS: 4 days    Jacinto Halim , [facility] 03/17/2021, 10:19 AM Please see Amion for pager number during day hours 7:00am-4:30pm

## 2021-03-18 ENCOUNTER — Inpatient Hospital Stay (HOSPITAL_COMMUNITY): Payer: Self-pay

## 2021-03-18 LAB — CULTURE, BLOOD (ROUTINE X 2)
Culture: NO GROWTH
Special Requests: ADEQUATE

## 2021-03-18 LAB — COMPREHENSIVE METABOLIC PANEL
ALT: 31 U/L (ref 0–44)
AST: 43 U/L — ABNORMAL HIGH (ref 15–41)
Albumin: 1.5 g/dL — ABNORMAL LOW (ref 3.5–5.0)
Alkaline Phosphatase: 177 U/L — ABNORMAL HIGH (ref 38–126)
Anion gap: 10 (ref 5–15)
BUN: 17 mg/dL (ref 6–20)
CO2: 27 mmol/L (ref 22–32)
Calcium: 7.5 mg/dL — ABNORMAL LOW (ref 8.9–10.3)
Chloride: 97 mmol/L — ABNORMAL LOW (ref 98–111)
Creatinine, Ser: 0.72 mg/dL (ref 0.61–1.24)
GFR, Estimated: >60 mL/min (ref >60)
Glucose, Bld: 143 mg/dL — ABNORMAL HIGH (ref 70–99)
Potassium: 3.4 mmol/L — ABNORMAL LOW (ref 3.5–5.1)
Sodium: 134 mmol/L — ABNORMAL LOW (ref 135–145)
Total Bilirubin: 1.3 mg/dL — ABNORMAL HIGH (ref 0.3–1.2)
Total Protein: 4.7 g/dL — ABNORMAL LOW (ref 6.5–8.1)

## 2021-03-18 LAB — HAPTOGLOBIN: Haptoglobin: 387 mg/dL — ABNORMAL HIGH (ref 17–317)

## 2021-03-18 LAB — PROCALCITONIN: Procalcitonin: 5.65 ng/mL

## 2021-03-18 LAB — BPAM PLATELET PHERESIS
Blood Product Expiration Date: 202207182359
ISSUE DATE / TIME: 202207170235
Unit Type and Rh: 5100

## 2021-03-18 LAB — CBC
HCT: 27.2 % — ABNORMAL LOW (ref 39.0–52.0)
Hemoglobin: 9.6 g/dL — ABNORMAL LOW (ref 13.0–17.0)
MCH: 29.6 pg (ref 26.0–34.0)
MCHC: 35.3 g/dL (ref 30.0–36.0)
MCV: 84 fL (ref 80.0–100.0)
Platelets: 71 10*3/uL — ABNORMAL LOW (ref 150–400)
RBC: 3.24 MIL/uL — ABNORMAL LOW (ref 4.22–5.81)
RDW: 13.8 % (ref 11.5–15.5)
WBC: 23.3 10*3/uL — ABNORMAL HIGH (ref 4.0–10.5)
nRBC: 0.2 % (ref 0.0–0.2)

## 2021-03-18 LAB — PREPARE PLATELET PHERESIS: Unit division: 0

## 2021-03-18 LAB — PROTIME-INR
INR: 1.2 (ref 0.8–1.2)
Prothrombin Time: 15.3 seconds — ABNORMAL HIGH (ref 11.4–15.2)

## 2021-03-18 LAB — HEPATITIS A ANTIBODY, TOTAL: hep A Total Ab: REACTIVE — AB

## 2021-03-18 LAB — HEPATITIS B SURFACE ANTIGEN: Hepatitis B Surface Ag: NONREACTIVE

## 2021-03-18 IMAGING — MR MR FEMUR*R* WO/W CM
5 of 14 series · 15 of 40 positions shown · IV contrast (gadavist)
Comparison: None.

CLINICAL DATA: Patient admitted [DATE] with abdominal swelling,
fevers and hypotension. Bilateral upper leg tenderness to palpation.

EXAM:
MR OF THE LEFT LOWER EXTREMITY WITHOUT AND WITH CONTRAST; MRI OF THE
RIGHT FEMUR WITHOUT AND WITH CONTRAST
TECHNIQUE: Multiplanar, multisequence MR imaging of the both upper legs was
performed both before and after administration of intravenous
contrast.
CONTRAST:  6 mL GADAVIST IV SOLN

[Series 3: T1 · coronal · 4.0mm · 0.43mm/px · 1 of 36 slices shown (1 of 2)]
[im 1/36]
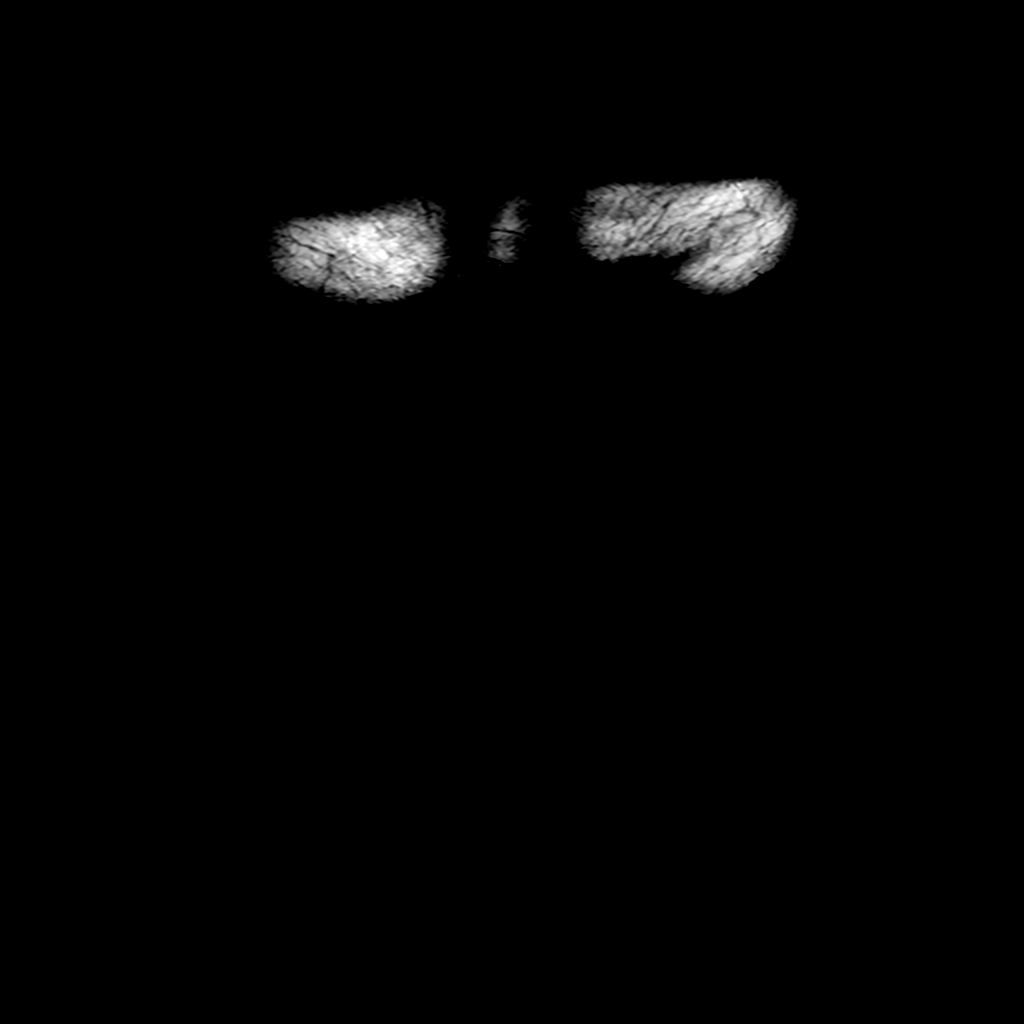

[Series 4: T1 · axial · 4.0mm · 0.51mm/px · z∈[-95,+260]mm · 3 of 74 slices shown (2 of 2)]
[im 1/74]
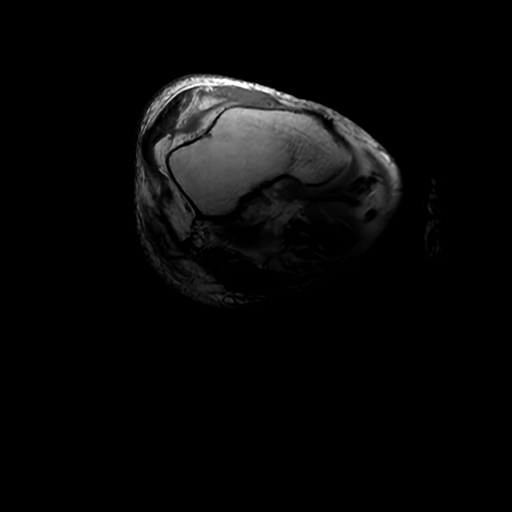
[im 37/74]
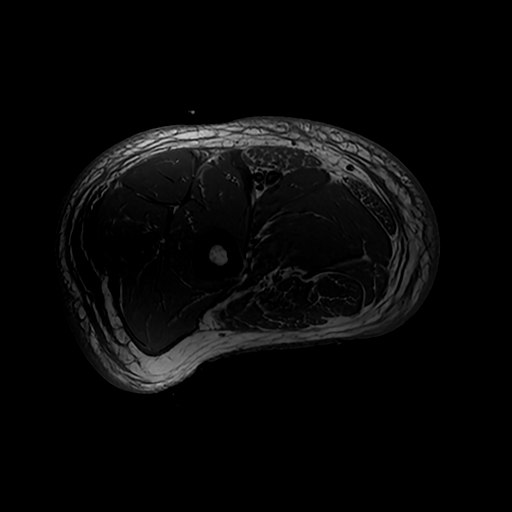
[im 74/74]
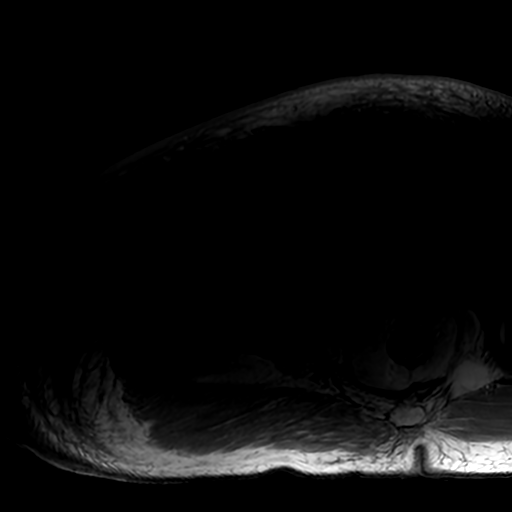

[Series 5: T1 fat-sat · axial · non-contrast · 4.0mm · 0.51mm/px · z∈[-97,+258]mm · 3 of 74 slices shown]
[im 1/74]
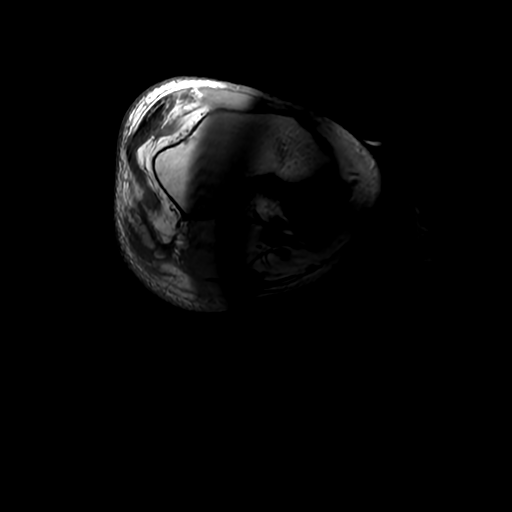
[im 37/74]
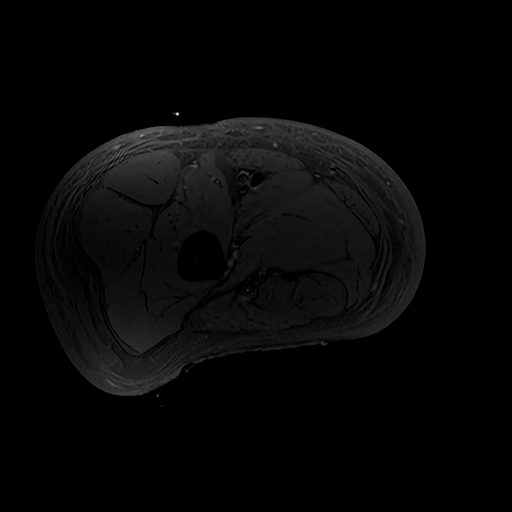
[im 74/74]
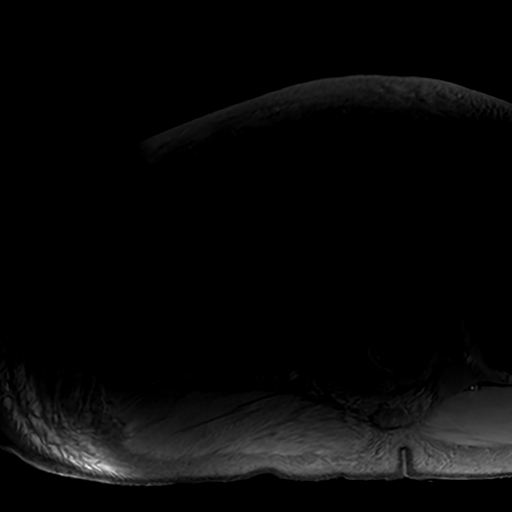

[Series 6: T2 fat-sat · axial · 4.0mm · 0.51mm/px · z∈[-97,+258]mm · 4 of 74 slices shown (1 of 2)]
[im 1/74]
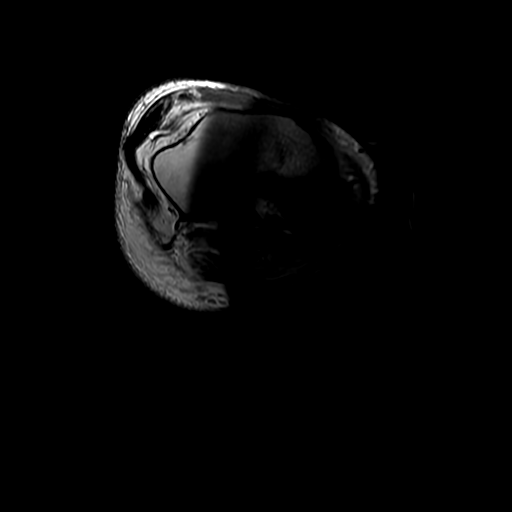
[im 25/74]
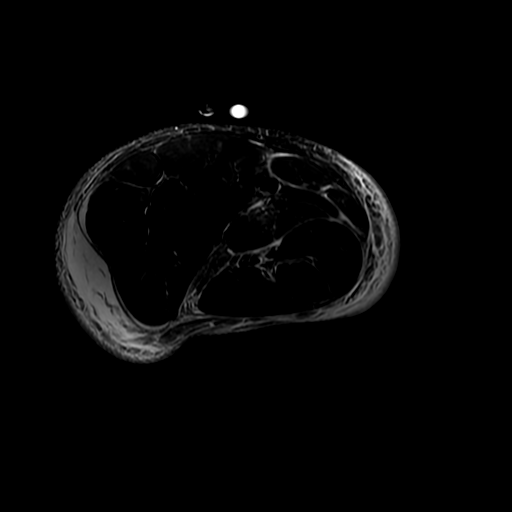
[im 49/74]
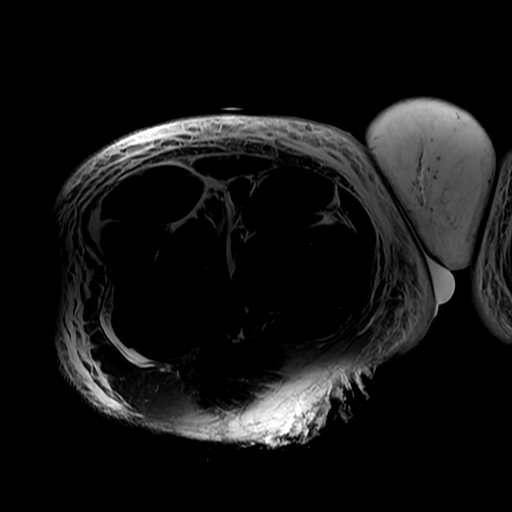
[im 74/74]
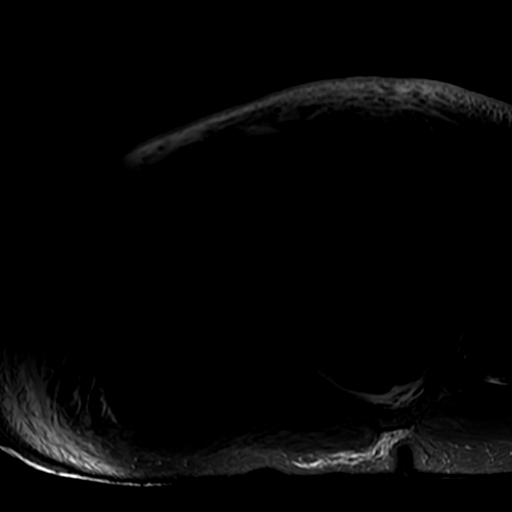

[Series 11: T2 fat-sat · axial · 4.0mm · 0.51mm/px · z∈[-147,+216]mm · 4 of 74 slices shown (2 of 2)]
[im 1/74]
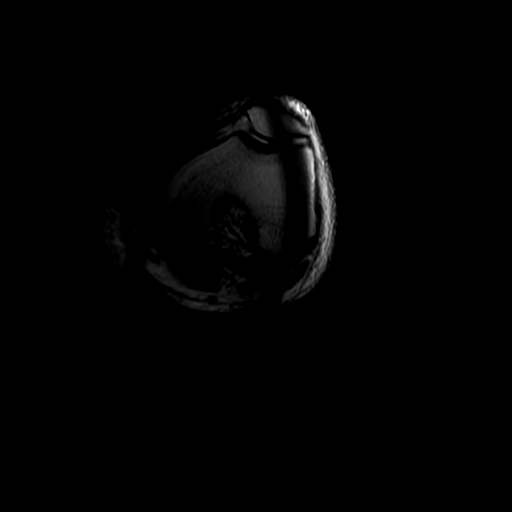
[im 25/74]
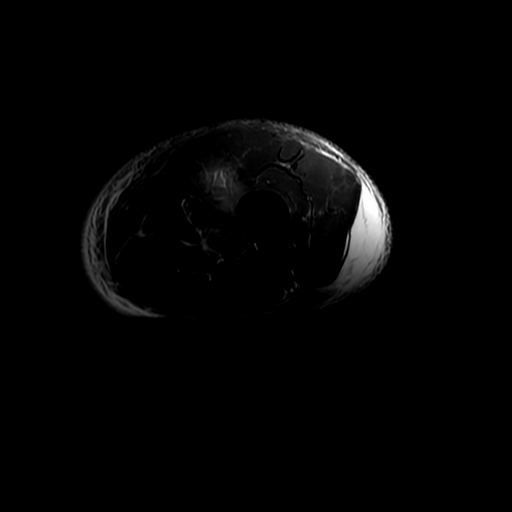
[im 49/74]
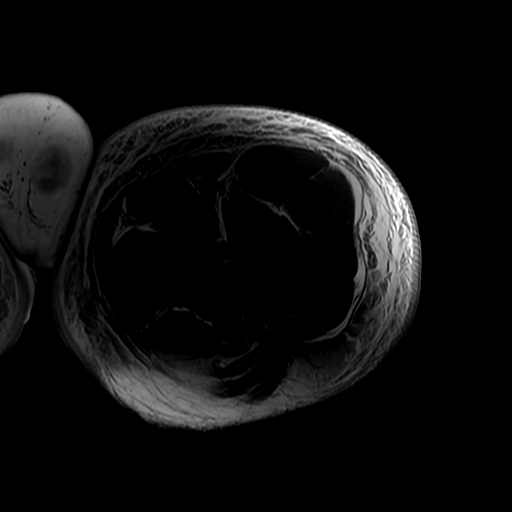
[im 74/74]
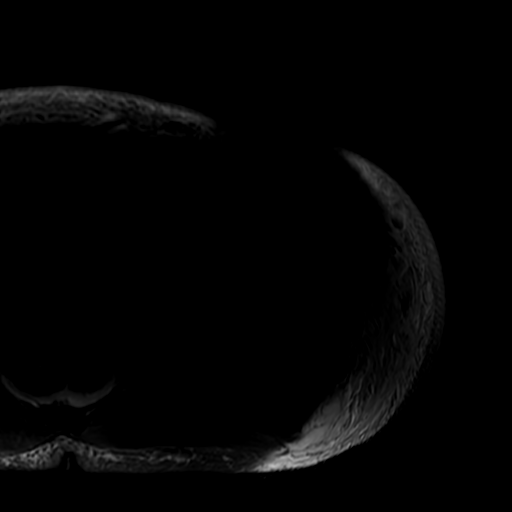

[15 of 40 positions shown; findings below may reference images not displayed]

FINDINGS: Bones/Joint/Cartilage

There is no marrow edema or enhancement to suggest osteomyelitis no
hip effusion is identified. No hip effusion on the right or left.
Small knee joint effusions are seen, larger on the left. No
fracture, stress change or worrisome lesion.

Ligaments

Negative.

Muscles and Tendons

A rim enhancing fluid collection measuring 2.6 cm craniocaudal by
1.2 cm AP by 1.3 cm transverse in the distal left vastus medialis is
consistent with an abscess. The abscess is centered approximately
9.5 cm above the superior pole of the left patella. No other
intramuscular abscess is identified on the right or left. There is
some edema and enhancement of fascial planes in the upper legs, more
notable on the left.

Soft tissues

Intense subcutaneous edema and enhancement present throughout
consistent with cellulitis. More focal but still somewhat
ill-defined fluid in the subcutaneous tissues of the left upper leg
measures approximately 11 cm craniocaudal x 3.7 cm AP x 1.2 cm
transverse is worrisome for phlegmon/early abscess. This collection
is in the lateral soft tissues centered approximately 23 cm above
the joint line of the left knee.

In the right leg, somewhat focal but it still ill-defined fluid in
the lateral soft tissues measures approximately 6 cm AP by up to 2
cm transverse by 10 cm craniocaudal and extends to the joint line.
IMPRESSION: Small abscess in the left vastus medialis is consistent with
pyomyositis.

Enhancement and edema and fascial planes of the upper legs
bilaterally is consistent with fasciitis appears more notable on the
left.

Intense cellulitis in all imaged subcutaneous fatty tissues. More
focal but ill-defined areas of fluid in both upper legs are
worrisome for phlegmon/early abscess and described above.

Negative for osteomyelitis.

Small bilateral knee joint effusions, larger on the left, cannot be
definitively characterized.

## 2021-03-18 MED ORDER — POTASSIUM CHLORIDE CRYS ER 20 MEQ PO TBCR
40.0000 meq | EXTENDED_RELEASE_TABLET | Freq: Once | ORAL | Status: AC
  Administered 2021-03-18: 40 meq via ORAL
  Filled 2021-03-18: qty 2

## 2021-03-18 MED ORDER — HYDROCODONE-ACETAMINOPHEN 5-325 MG PO TABS
1.0000 | ORAL_TABLET | Freq: Four times a day (QID) | ORAL | Status: DC | PRN
  Administered 2021-03-18 (×2): 1 via ORAL
  Filled 2021-03-18 (×2): qty 1

## 2021-03-18 MED ORDER — GADOBUTROL 1 MMOL/ML IV SOLN
6.0000 mL | Freq: Once | INTRAVENOUS | Status: AC | PRN
  Administered 2021-03-18: 6 mL via INTRAVENOUS

## 2021-03-18 NOTE — Progress Notes (Addendum)
2 Days Post-Op  Subjective: CC: Overnight notes reviewed. Continues to get resuscitated with multiple LR boluses. Got albumin last night. Foley placed for I/O monitoring. CCM following. ID following. BP and HR slightly improved. Complains of pain over incisions. Also having pain in bilateral thighs   Objective: Vital signs in last 24 hours: Temp:  [97.8 F (36.6 C)-98.5 F (36.9 C)] 98.1 F (36.7 C) (07/18 0729) Pulse Rate:  [90-101] 90 (07/18 0729) Resp:  [13-26] 13 (07/18 0729) BP: (93-111)/(63-73) 111/68 (07/18 0729) SpO2:  [93 %-96 %] 93 % (07/18 0729)    Intake/Output from previous day: 07/17 0701 - 07/18 0700 In: 1410 [P.O.:960; IV Piggyback:450] Out: 4575 [Urine:4575] Intake/Output this shift: No intake/output data recorded.  PE: Gen: pleasant, laying in bed in NAD Lungs: Normal respiratory rate and effort  Abd: see pictures below. Abd soft and tender over incisions appropriately.  Infraumbilical incision with healthy granulation tissue at the base. Does not track/undermine. No drainage, bleeding this am. No surrounding erythema.  Right flank wound x 2 with healthy granulation tissue at the base. Superior incision tracks medially. No drainage. Continued induration and erythema. Some maceration of surrounding skin.   Left flank wound with healthy granulation tissue at the base. No tracking/undermining. No drainage. Improved induration and erythema from yesterday.    Infraumbilical     Right side      Left side      Lab Results:  Recent Labs    03/17/21 0603 03/17/21 1109 03/18/21 0056  WBC 26.1*  --  23.3*  HGB 10.5*  --  9.6*  HCT 30.2*  --  27.2*  PLT 50* 56* 71*    BMET Recent Labs    03/17/21 0033 03/18/21 0056  NA 127* 134*  K 3.8 3.4*  CL 94* 97*  CO2 26 27  GLUCOSE 204* 143*  BUN 12 17  CREATININE 0.88 0.72  CALCIUM 7.6* 7.5*    PT/INR Recent Labs    03/17/21 1109 03/18/21 0056  LABPROT 15.2 15.3*  INR 1.2 1.2     CMP     Component Value Date/Time   NA 134 (L) 03/18/2021 0056   K 3.4 (L) 03/18/2021 0056   CL 97 (L) 03/18/2021 0056   CO2 27 03/18/2021 0056   GLUCOSE 143 (H) 03/18/2021 0056   BUN 17 03/18/2021 0056   CREATININE 0.72 03/18/2021 0056   CALCIUM 7.5 (L) 03/18/2021 0056   PROT 4.7 (L) 03/18/2021 0056   ALBUMIN 1.5 (L) 03/18/2021 0056   AST 43 (H) 03/18/2021 0056   ALT 31 03/18/2021 0056   ALKPHOS 177 (H) 03/18/2021 0056   BILITOT 1.3 (H) 03/18/2021 0056   GFRNONAA >60 03/18/2021 0056   Lipase     Component Value Date/Time   LIPASE 18 03/13/2021 0724       Studies/Results: No results found.  Anti-infectives: Anti-infectives (From admission, onward)    Start     Dose/Rate Route Frequency Ordered Stop   03/17/21 1345  Ampicillin-Sulbactam (UNASYN) 3 g in sodium chloride 0.9 % 100 mL IVPB        3 g 200 mL/hr over 30 Minutes Intravenous Every 6 hours 03/17/21 1255     03/17/21 0900  clindamycin (CLEOCIN) IVPB 600 mg  Status:  Discontinued        600 mg 100 mL/hr over 30 Minutes Intravenous Every 8 hours 03/17/21 0759 03/18/21 0953   03/14/21 1600  penicillin G potassium 12 Million Units in dextrose 5 % 500  mL continuous infusion  Status:  Discontinued        12 Million Units 41.7 mL/hr over 12 Hours Intravenous Every 12 hours 03/14/21 1453 03/17/21 0747   03/14/21 1200  penicillin G potassium 4 Million Units in dextrose 5 % 250 mL IVPB  Status:  Discontinued        4 Million Units 250 mL/hr over 60 Minutes Intravenous Every 4 hours 03/14/21 0901 03/14/21 1453   03/13/21 2330  clindamycin (CLEOCIN) IVPB 600 mg        600 mg 100 mL/hr over 30 Minutes Intravenous Every 8 hours 03/13/21 2238 03/16/21 2303   03/13/21 2315  penicillin G potassium 4 Million Units in dextrose 5 % 250 mL IVPB  Status:  Discontinued        4 Million Units 250 mL/hr over 60 Minutes Intravenous Every 6 hours 03/13/21 2227 03/13/21 2231   03/13/21 2315  penicillin G potassium 4 Million Units  in dextrose 5 % 250 mL IVPB  Status:  Discontinued        4 Million Units 250 mL/hr over 60 Minutes Intravenous Every 6 hours 03/13/21 2238 03/14/21 0901   03/13/21 0845  vancomycin variable dose per unstable renal function (pharmacist dosing)  Status:  Discontinued         Does not apply See admin instructions 03/13/21 0845 03/13/21 2242   03/13/21 0845  vancomycin (VANCOREADY) IVPB 1250 mg/250 mL        1,250 mg 166.7 mL/hr over 90 Minutes Intravenous  Once 03/13/21 0843 03/13/21 1240   03/13/21 0830  cefTRIAXone (ROCEPHIN) 2 g in sodium chloride 0.9 % 100 mL IVPB        2 g 200 mL/hr over 30 Minutes Intravenous  Once 03/13/21 0820 03/13/21 1002        Assessment/Plan POD 2 s/p I&D followed by debridement of 5x5x3 cm area of necrotic tissue inferior to umbilicus at site of bite wound, Incision and debridement times two of right flank bullae/? (5x3x2 and 4x3x2 cm), Incision and debridement of left flank bullae/? (5x2x2 cm) for infraumbilical abscess, bilateral flank anasarca - Dr. Dwain Sarna - 03/16/21 - WBC remains elevated at 23.3 - no further debridement needed. Infraumbilical wound looks good - MR of thighs pending per ID - BID WTD - Abx per ID. CX's pending - abundant gram positive cocci - Appreciate Primary, ID's and CCM assistance   FEN - Reg VTE - SCDs, per primary ID - Unasyn/Clinda Foley - per primary  Group A strep bacteremia  Elevated LFTs Thrombocytopenia - plt transfusion yesterday. DIC panel pending. PLT 50 ABL anemia - hgb stable   Video spanish interpretor was utilized for the entirety of subjective and exam   No acute surgical needs at this time - we will sign off but please contact with any further needs   LOS: 5 days    Eric Form , [facility] 03/18/2021, 10:39 AM Please see Amion for pager number during day hours 7:00am-4:30pm

## 2021-03-18 NOTE — Progress Notes (Addendum)
HD#5 Subjective:   Interview conducted with video interpreter services at bedside (ID [phone])  Patient says that he is doing a lot better today, says that his pain is improving. He feels like he is less swollen than he was. He continues to pee a lot. Says that he has been able to talk to his family and keeps them updated on his condition.   Objective:  Vital signs in last 24 hours: Vitals:   03/18/21 0000 03/18/21 0655 03/18/21 0700 03/18/21 0729  BP: 93/65 106/73 111/68 111/68  Pulse: 97 91 95 90  Resp: 20 (!) 26 (!) 23 13  Temp: 97.8 F (36.6 C)   98.1 F (36.7 C)  TempSrc: Oral   Oral  SpO2: 94% 93% 96% 93%  Weight:      Height:       Supplemental O2: Nasal Cannula SpO2: 93 % O2 Flow Rate (L/min): 2 L/min   Physical Exam:  Constitutional: WNWD man laying in bed, appears more comfortable than yesterday Cardiovascular: regular rate and rhythm, no m/r/g Pulmonary/Chest: normal work of breathing on room air, lungs clear to auscultation bilaterally Neurological: alert & answering questions appropriately Skin: less erythema and induration overlying the central abdominal incision, dressings c/d/i  Filed Weights   03/13/21 0800 03/16/21 0400  Weight: 59 kg 59.3 kg     Intake/Output Summary (Last 24 hours) at 03/18/2021 1025 Last data filed at 03/18/2021 0640 Gross per 24 hour  Intake 1410 ml  Output 4375 ml  Net -2965 ml   Net IO Since Admission: -21.6 mL [03/18/21 1025]  Pertinent Labs: CBC Latest Ref Rng & Units 03/18/2021 03/17/2021 03/17/2021  WBC 4.0 - 10.5 K/uL 23.3(H) - 26.1(H)  Hemoglobin 13.0 - 17.0 g/dL 6.3(J) - 10.5(L)  Hematocrit 39.0 - 52.0 % 27.2(L) - 30.2(L)  Platelets 150 - 400 K/uL 71(L) 56(L) 50(L)    CMP Latest Ref Rng & Units 03/18/2021 03/17/2021 03/16/2021  Glucose 70 - 99 mg/dL 497(W) 263(Z) 858(I)  BUN 6 - 20 mg/dL 17 12 16   Creatinine 0.61 - 1.24 mg/dL 5.02 7.74  Sodium 135 - 145 mmol/L 134(L) 127(L) 129(L)  Potassium 3.5 - 5.1  mmol/L 3.4(L) 3.8 3.8  Chloride 98 - 111 mmol/L 97(L) 94(L) 98  CO2 22 - 32 mmol/L 27 26 23   Calcium 8.9 - 10.3 mg/dL 7.5(L) 7.6(L) 7.8(L)  Total Protein 6.5 - 8.1 g/dL 4.7(L) 4.4(L) 4.5(L)  Total Bilirubin 0.3 - 1.2 mg/dL 1.28) 2.1(H) 3.0(H)  Alkaline Phos 38 - 126 U/L 177(H) 159(H) 163(H)  AST 15 - 41 U/L 43(H) 30 43(H)  ALT 0 - 44 U/L 31 29 38    Imaging: No results found.  Assessment/Plan:   Principal Problem:   Sepsis (HCC) Active Problems:   Abdominal wall cellulitis   Acute renal failure (HCC)   Lactic acidosis   Group A Streptococcal Bacteremia   Acute respiratory failure (HCC)   Volume overload   Hypoalbuminemia   Patient Summary: Bryce [name] is a 35 y.o. with  no known past medical history who presented with sepsis secondary to invasive group A strep.   #Sepsis 2/2 to invasive GAS SSI with evidence of necrosis #Transient hypotension: improved Patient s/p surgical debridement of his abdomen and bilateral flanks on 7/16. Antibiotics were broadened to unasyn and clindamycin on 7/18 and patient received albumin with one dose of lasix. He has put out about 4L of urine in the past 24 hours and has had a noticeable improvement in his  swelling and pain. He is still experiencing transient episodes of hypotension but MAPs are trending in the right direction. Prognosis is still guarded but he seems to be improving. Still planning to get further imaging of lower extremities to rule out extension of infection due to continued pain.  - f/u MRI - ID and surgery following, appreciate assistance - continue unasyn, d/c clindamycin 7/18 - holding off on further diuresis at this time - f/u surgical cultures  #Normocytic Anemia: #Thrombocytopenia: Patient is postop day 2 from surgical debridement with significant thrombocytopenia and evidence of abdominal oozing. Is status post transfusion of 1 unit platelets. Platelets trending in the correct direction, up to 71  today. -daily CBC  Prior to Admission Living Arrangement: Home Anticipated Discharge Location: Home Barriers to Discharge: Continued medical management Dispo: Anticipated discharge in 2-3 days  [name], MD Internal Medicine Resident PGY-1 Pager [phone] Please contact the on call pager after 5 pm and on weekends at [phone].

## 2021-03-18 NOTE — Progress Notes (Signed)
Wound care and dressing changed per order. C/o pain 8/10 abdomen. Pain med given. CHG bath and linen and gown change. Continuing to monitor.

## 2021-03-18 NOTE — Consult Note (Signed)
WOC consulted for surgical wound that have been debrided.  No further debridements planned.  Surgery has placed topical care orders in the computer. No further consultation needs. Saline moist gauze dressings are appropriate for this patient and will be the most cost effective dressings for the patient at DC.    Follow up at DC could be arranged per [facility] with wound care center of patient's choice, community health clinic, or [facility].    If recommendations for care (consult) needed please re consult.    Re consult if needed, will not follow at this time. Thanks  Tijuan Dantes [employer], RN,CWOCN, CNS, CWON-AP [phone])
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
## 2021-03-19 LAB — SURGICAL PATHOLOGY

## 2021-03-19 LAB — CULTURE, BLOOD (ROUTINE X 2)
Culture: NO GROWTH
Culture: NO GROWTH
Special Requests: ADEQUATE
Special Requests: ADEQUATE

## 2021-03-19 LAB — COMPREHENSIVE METABOLIC PANEL
ALT: 69 U/L — ABNORMAL HIGH (ref 0–44)
AST: 122 U/L — ABNORMAL HIGH (ref 15–41)
Albumin: 1.5 g/dL — ABNORMAL LOW (ref 3.5–5.0)
Alkaline Phosphatase: 369 U/L — ABNORMAL HIGH (ref 38–126)
Anion gap: 7 (ref 5–15)
BUN: 17 mg/dL (ref 6–20)
CO2: 27 mmol/L (ref 22–32)
Calcium: 7.7 mg/dL — ABNORMAL LOW (ref 8.9–10.3)
Chloride: 99 mmol/L (ref 98–111)
Creatinine, Ser: 0.74 mg/dL (ref 0.61–1.24)
GFR, Estimated: >60 mL/min (ref >60)
Glucose, Bld: 116 mg/dL — ABNORMAL HIGH (ref 70–99)
Potassium: 4 mmol/L (ref 3.5–5.1)
Sodium: 133 mmol/L — ABNORMAL LOW (ref 135–145)
Total Bilirubin: 1.8 mg/dL — ABNORMAL HIGH (ref 0.3–1.2)
Total Protein: 4.9 g/dL — ABNORMAL LOW (ref 6.5–8.1)

## 2021-03-19 LAB — HEPATITIS B SURFACE ANTIBODY, QUANTITATIVE: Hep B S AB Quant (Post): <3.1 m[IU]/mL — ABNORMAL LOW (ref >9.9)

## 2021-03-19 LAB — PROTIME-INR
INR: 1.1 (ref 0.8–1.2)
Prothrombin Time: 14.5 seconds (ref 11.4–15.2)

## 2021-03-19 LAB — CBC
HCT: 28.7 % — ABNORMAL LOW (ref 39.0–52.0)
Hemoglobin: 9.5 g/dL — ABNORMAL LOW (ref 13.0–17.0)
MCH: 29 pg (ref 26.0–34.0)
MCHC: 33.1 g/dL (ref 30.0–36.0)
MCV: 87.5 fL (ref 80.0–100.0)
Platelets: 101 10*3/uL — ABNORMAL LOW (ref 150–400)
RBC: 3.28 MIL/uL — ABNORMAL LOW (ref 4.22–5.81)
RDW: 14.2 % (ref 11.5–15.5)
WBC: 16.6 10*3/uL — ABNORMAL HIGH (ref 4.0–10.5)
nRBC: 0.4 % — ABNORMAL HIGH (ref 0.0–0.2)

## 2021-03-19 LAB — PATHOLOGIST SMEAR REVIEW

## 2021-03-19 MED ORDER — ADULT MULTIVITAMIN W/MINERALS CH
1.0000 | ORAL_TABLET | Freq: Every day | ORAL | Status: DC
  Administered 2021-03-19 – 2021-03-23 (×5): 1 via ORAL
  Filled 2021-03-19 (×5): qty 1

## 2021-03-19 MED ORDER — ACETAMINOPHEN 500 MG PO TABS
1000.0000 mg | ORAL_TABLET | Freq: Four times a day (QID) | ORAL | Status: DC
  Administered 2021-03-19 – 2021-03-23 (×17): 1000 mg via ORAL
  Filled 2021-03-19 (×19): qty 2

## 2021-03-19 MED ORDER — HYDROMORPHONE HCL 1 MG/ML IJ SOLN: 0.5000 mg | Freq: Once | INTRAMUSCULAR | Status: DC

## 2021-03-19 MED ORDER — JUVEN PO PACK
1.0000 | PACK | Freq: Two times a day (BID) | ORAL | Status: DC
  Administered 2021-03-20 – 2021-03-23 (×8): 1 via ORAL
  Filled 2021-03-19 (×8): qty 1

## 2021-03-19 MED ORDER — ENSURE ENLIVE PO LIQD
237.0000 mL | Freq: Every day | ORAL | Status: DC
  Administered 2021-03-19 – 2021-03-22 (×4): 237 mL via ORAL

## 2021-03-19 MED ORDER — HYDROMORPHONE HCL 1 MG/ML IJ SOLN
0.5000 mg | Freq: Four times a day (QID) | INTRAMUSCULAR | Status: DC | PRN
  Administered 2021-03-19 – 2021-03-23 (×11): 0.5 mg via INTRAVENOUS
  Filled 2021-03-19 (×11): qty 0.5

## 2021-03-19 MED ORDER — CHLORHEXIDINE GLUCONATE CLOTH 2 % EX PADS
6.0000 | MEDICATED_PAD | Freq: Every day | CUTANEOUS | Status: DC
  Administered 2021-03-19 – 2021-03-21 (×3): 6 via TOPICAL

## 2021-03-19 MED ORDER — POLYETHYLENE GLYCOL 3350 17 G PO PACK
17.0000 g | PACK | Freq: Two times a day (BID) | ORAL | Status: DC
  Administered 2021-03-19 – 2021-03-23 (×8): 17 g via ORAL
  Filled 2021-03-19 (×8): qty 1

## 2021-03-19 MED ORDER — HYDROCODONE-ACETAMINOPHEN 5-325 MG PO TABS
1.0000 | ORAL_TABLET | Freq: Four times a day (QID) | ORAL | Status: DC | PRN
  Administered 2021-03-19: 1 via ORAL
  Filled 2021-03-19: qty 1

## 2021-03-19 NOTE — Progress Notes (Signed)
HD#6 Subjective:   History was obtained at bedside using interpreter services ID# [id number]  Feeling a little better this morning, says that his pain seems to come and go. The pain in his legs has been worse on the right leg than the left but both hurt intermittently.   Objective:  Vital signs in last 24 hours: Vitals:   03/18/21 2046 03/18/21 2344 03/19/21 0308 03/19/21 0707  BP: 120/67 108/74 110/65 114/60  Pulse: 98 94 99 98  Resp: 18 18 (!) 22 18  Temp: 98.2 F (36.8 C) 98.3 F (36.8 C) 97.8 F (36.6 C) 98.1 F (36.7 C)  TempSrc: Oral Oral Oral Oral  SpO2: 100% 92% 92% 96%  Weight:      Height:       Supplemental O2: Nasal Cannula SpO2: 96 % O2 Flow Rate (L/min): 2 L/min   Physical Exam:  Constitutional: WNWD man laying in bed, in NAD Cardiovascular: regular rate and rhythm, no m/r/g Pulmonary/Chest: normal work of breathing on room air, lungs clear to auscultation bilaterally Neurological: alert & answering questions appropriately Skin: less erythema and induration overlying the central abdominal incision, dressings c/d/I . No skin changes, fluid collection, or fluctuance on the thighs noted.   Filed Weights   03/13/21 0800 03/16/21 0400  Weight: 59 kg 59.3 kg     Intake/Output Summary (Last 24 hours) at 03/19/2021 0719 Last data filed at 03/19/2021 0000 Gross per 24 hour  Intake 420 ml  Output 1000 ml  Net -580 ml   Net IO Since Admission: -601.6 mL [03/19/21 0719]  Pertinent Labs: CBC Latest Ref Rng & Units 03/19/2021 03/18/2021 03/17/2021  WBC 4.0 - 10.5 K/uL 16.6(H) 23.3(H) -  Hemoglobin 13.0 - 17.0 g/dL 6.7(E) 9.3(Y) -  Hematocrit 39.0 - 52.0 % 28.7(L) 27.2(L) -  Platelets 150 - 400 K/uL 101(L) 71(L) 56(L)    CMP Latest Ref Rng & Units 03/19/2021 03/18/2021 03/17/2021  Glucose 70 - 99 mg/dL 101(B) 510(C) 585(I)  BUN 6 - 20 mg/dL 17 17 12   Creatinine 0.61 - 1.24 mg/dL 7.78 2.42  Sodium 135 - 145 mmol/L 133(L) 134(L) 127(L)  Potassium 3.5 - 5.1  mmol/L 4.0 3.4(L) 3.8  Chloride 98 - 111 mmol/L 99 97(L) 94(L)  CO2 22 - 32 mmol/L 27 27 26   Calcium 8.9 - 10.3 mg/dL 7.7(L) 7.5(L) 7.6(L)  Total Protein 6.5 - 8.1 g/dL 4.9(L) 4.7(L) 4.4(L)  Total Bilirubin 0.3 - 1.2 mg/dL 3.53) 1.3(H) 2.1(H)  Alkaline Phos 38 - 126 U/L 369(H) 177(H) 159(H)  AST 15 - 41 U/L 122(H) 43(H) 30  ALT 0 - 44 U/L 69(H) 31 29    Imaging: MR FEMUR RIGHT W WO CONTRAST  Result Date: 03/18/2021 CLINICAL DATA:  Patient admitted 03/13/2021 with abdominal swelling, fevers and hypotension. Bilateral upper leg tenderness to palpation. EXAM: MR OF THE LEFT LOWER EXTREMITY WITHOUT AND WITH CONTRAST; MRI OF THE RIGHT FEMUR WITHOUT AND WITH CONTRAST TECHNIQUE: Multiplanar, multisequence MR imaging of the both upper legs was performed both before and after administration of intravenous contrast. CONTRAST:  6 mL GADAVIST IV SOLN COMPARISON:  None. FINDINGS: Bones/Joint/Cartilage There is no marrow edema or enhancement to suggest osteomyelitis no hip effusion is identified. No hip effusion on the right or left. Small knee joint effusions are seen, larger on the left. No fracture, stress change or worrisome lesion. Ligaments Negative. Muscles and Tendons A rim enhancing fluid collection measuring 2.6 cm craniocaudal by 1.2 cm AP by 1.3 cm transverse in the  distal left vastus medialis is consistent with an abscess. The abscess is centered approximately 9.5 cm above the superior pole of the left patella. No other intramuscular abscess is identified on the right or left. There is some edema and enhancement of fascial planes in the upper legs, more notable on the left. Soft tissues Intense subcutaneous edema and enhancement present throughout consistent with cellulitis. More focal but still somewhat ill-defined fluid in the subcutaneous tissues of the left upper leg measures approximately 11 cm craniocaudal x 3.7 cm AP x 1.2 cm transverse is worrisome for phlegmon/early abscess. This collection is  in the lateral soft tissues centered approximately 23 cm above the joint line of the left knee. In the right leg, somewhat focal but it still ill-defined fluid in the lateral soft tissues measures approximately 6 cm AP by up to 2 cm transverse by 10 cm craniocaudal and extends to the joint line. IMPRESSION: Small abscess in the left vastus medialis is consistent with pyomyositis. Enhancement and edema and fascial planes of the upper legs bilaterally is consistent with fasciitis appears more notable on the left. Intense cellulitis in all imaged subcutaneous fatty tissues. More focal but ill-defined areas of fluid in both upper legs are worrisome for phlegmon/early abscess and described above. Negative for osteomyelitis. Small bilateral knee joint effusions, larger on the left, cannot be definitively characterized. Electronically Signed   By: Drusilla Kanner M.D.   On: 03/18/2021 17:14   MR FEMUR LEFT W WO CONTRAST  Result Date: 03/18/2021 CLINICAL DATA:  Patient admitted 03/13/2021 with abdominal swelling, fevers and hypotension. Bilateral upper leg tenderness to palpation. EXAM: MR OF THE LEFT LOWER EXTREMITY WITHOUT AND WITH CONTRAST; MRI OF THE RIGHT FEMUR WITHOUT AND WITH CONTRAST TECHNIQUE: Multiplanar, multisequence MR imaging of the both upper legs was performed both before and after administration of intravenous contrast. CONTRAST:  6 mL GADAVIST IV SOLN COMPARISON:  None. FINDINGS: Bones/Joint/Cartilage There is no marrow edema or enhancement to suggest osteomyelitis no hip effusion is identified. No hip effusion on the right or left. Small knee joint effusions are seen, larger on the left. No fracture, stress change or worrisome lesion. Ligaments Negative. Muscles and Tendons A rim enhancing fluid collection measuring 2.6 cm craniocaudal by 1.2 cm AP by 1.3 cm transverse in the distal left vastus medialis is consistent with an abscess. The abscess is centered approximately 9.5 cm above the superior  pole of the left patella. No other intramuscular abscess is identified on the right or left. There is some edema and enhancement of fascial planes in the upper legs, more notable on the left. Soft tissues Intense subcutaneous edema and enhancement present throughout consistent with cellulitis. More focal but still somewhat ill-defined fluid in the subcutaneous tissues of the left upper leg measures approximately 11 cm craniocaudal x 3.7 cm AP x 1.2 cm transverse is worrisome for phlegmon/early abscess. This collection is in the lateral soft tissues centered approximately 23 cm above the joint line of the left knee. In the right leg, somewhat focal but it still ill-defined fluid in the lateral soft tissues measures approximately 6 cm AP by up to 2 cm transverse by 10 cm craniocaudal and extends to the joint line. IMPRESSION: Small abscess in the left vastus medialis is consistent with pyomyositis. Enhancement and edema and fascial planes of the upper legs bilaterally is consistent with fasciitis appears more notable on the left. Intense cellulitis in all imaged subcutaneous fatty tissues. More focal but ill-defined areas of fluid in both  upper legs are worrisome for phlegmon/early abscess and described above. Negative for osteomyelitis. Small bilateral knee joint effusions, larger on the left, cannot be definitively characterized. Electronically Signed   By: Drusilla Kanner M.D.   On: 03/18/2021 17:14    Assessment/Plan:   Principal Problem:   Sepsis (HCC) Active Problems:   Abdominal wall cellulitis   Acute renal failure (HCC)   Lactic acidosis   Group A Streptococcal Bacteremia   Acute respiratory failure (HCC)   Volume overload   Hypoalbuminemia   Patient Summary: Bryce [name] is a 35 y.o. with  no known past medical history who presented with sepsis secondary to invasive group A strep.    #Sepsis 2/2 to invasive GAS SSI with evidence of necrosis #Transient hypotension:  improved Patient s/p surgical debridement of his abdomen and bilateral flanks on 7/16.  Antibiotics were broadened to unasyn and clindamycin on 7/18 and patient received albumin with one dose of lasix 7/17. Blood pressures continue to improve, with most readings now with  systolics running in the low 100s. Patient continues to self diurese well.  - f/u surgical cultures - ID following, appreciate assistance - continue unasyn - nutrition consult to optimize for wound healing  #Left Thigh Pyomyositis MRI significant for small abscess in the left vastus medialis consistent with pyomyositis. Will reach back out to surgery vs IR today to discuss necessity of surgical drainage. Will also discuss whether MRSA coverage is warranted with ID due to prevalence of S. Aureus pyomyositis. However, due to overall continued clinical improvement favor Strep pyogenes as the source and will hold off on vancomycin at this time.  - discuss with ID and surgery - continue Unasyn as above  #Normocytic Anemia: #Thrombocytopenia: improving Patient is postop day 3 from surgical debridement with significant thrombocytopenia and evidence of abdominal oozing. Is status post transfusion of 1 unit platelets. Platelets continue to uptrend 101 on 7/18.  -daily CBC  #Hyponatremia: Stable -daily BMP  Prior to Admission Living Arrangement: Home Anticipated Discharge Location: Home Barriers to Discharge: Continued medical management Dispo: Anticipated discharge in 2-3 days  [name], MD Internal Medicine Resident PGY-1 Pager [phone] Please contact the on call pager after 5 pm and on weekends at [phone].

## 2021-03-19 NOTE — Progress Notes (Addendum)
Initial Nutrition Assessment  DOCUMENTATION CODES:   Not applicable  INTERVENTION:   -1 packet Juven BID, each packet provides 95 calories, 2.5 grams of protein (collagen), and 9.8 grams of carbohydrate (3 grams sugar); also contains 7 grams of L-arginine and L-glutamine, 300 mg vitamin C, 15 mg vitamin E, 1.2 mcg vitamin B-12, 9.5 mg zinc, 200 mg calcium, and 1.5 g  Calcium Beta-hydroxy-Beta-methylbutyrate to support wound healing  -MVI with minerals daily -Ensure Enlive po daily, each supplement provides 350 kcal and 20 grams of protein  -Pt with no documented BM since admission; consider initiation of bowel regimen. Discussed with MD  NUTRITION DIAGNOSIS:   Increased nutrient needs related to post-op healing as evidenced by estimated needs.  GOAL:   Patient will meet greater than or equal to 90% of their needs  MONITOR:   PO intake, Supplement acceptance, Labs, Weight trends, Skin, I & O's  REASON FOR ASSESSMENT:   Consult Assessment of nutrition requirement/status, Poor PO, Wound healing  ASSESSMENT:   Bryce [name] is a 35 y.o. with no known past medical history who presented with abdominal wound and admit for sepsis secondary to cellulitis  Pt admitted with sepsis secondary to abdominal cellulitis.   7/16- s/p Procedure: Incision and drainage followed by debridement of 5x5x3 cm area of necrotic tissue inferior to umbilicus at site of bite wound Incision and debridement times two of right flank bullae/? Infection- these measure 5x3x2 and 4x3x2 cm Incision and debridement of left flank bullae/? Infection- this measures 5x2x2 cm  Reviewed I/O's: -580 ml x 24 hours and -602 ml since admission  UOP: 1 L x 24 hours  Pt sleeping soundly at time of visit. He did not respond to voice or touch.   Observed breakfast tray; pt consumed 100%. Documented meal completions 75%.   No wt hx available to assess at this time.   Pt with increased nutritional needs for  wound healing and would benefit from addition of oral nutrition supplements.   Albumin has a half-life of 21 days and is strongly affected by stress response and inflammatory process, therefore, do not expect to see an improvement in this lab value during acute hospitalization. When a patient presents with low albumin, it is likely skewed due to the acute inflammatory response.  Unless it is suspected that patient had poor PO intake or malnutrition prior to admission, then RD should not be consulted solely for low albumin. Note that low albumin is no longer used to diagnose malnutrition; [facility] uses the new malnutrition guidelines published by the American Society for Parenteral and Enteral Nutrition (A.S.P.E.N.) and the Academy of Nutrition and Dietetics (AND).     Labs reviewed: Na: 133.   NUTRITION - FOCUSED PHYSICAL EXAM:  Flowsheet Row Most Recent Value  Orbital Region No depletion  Upper Arm Region No depletion  Thoracic and Lumbar Region No depletion  Buccal Region No depletion  Temple Region No depletion  Clavicle Bone Region No depletion  Clavicle and Acromion Bone Region No depletion  Scapular Bone Region No depletion  Dorsal Hand No depletion  Patellar Region No depletion  Anterior Thigh Region No depletion  Posterior Calf Region No depletion  Edema (RD Assessment) None  Hair Reviewed  Eyes Reviewed  Mouth Reviewed  Skin Reviewed  Nails Reviewed       Diet Order:   Diet Order             Diet regular Room service appropriate? Yes; Fluid consistency: Thin  Diet effective now                   EDUCATION NEEDS:   No education needs have been identified at this time  Skin:  Skin Assessment: Skin Integrity Issues: Skin Integrity Issues:: Other (Comment), Incisions Incisions: closed abdomen Other: non-pressure wounds to rt and lt hip  Last BM:  Unknown  Height:   Ht Readings from Last 1 Encounters:  03/13/21 5\' 5"  (1.651 m)    Weight:   Wt  Readings from Last 1 Encounters:  03/16/21 59.3 kg    Ideal Body Weight:  61.8 kg  BMI:  Body mass index is 21.76 kg/m.  Estimated Nutritional Needs:   Kcal:  1800-2000  Protein:  90-105 grams  Fluid:  > 1.8 L    03/18/21, RD, LDN, CDCES Registered Dietitian II Certified Diabetes Care and Education Specialist Please refer to [facility] for RD and/or RD on-call/weekend/after hours pager
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
## 2021-03-19 NOTE — Consult Note (Signed)
Reason for Consult:Polymyositis Referring Physician: Carlynn PurlErik Hoffman Time called: 1447 Time at bedside: [id number]   Bryce [name] is an 35 y.o. male.  HPI: Bryce [name] presented to the ED late last week with a GAS infection of his abdominal wall. He went for debridement by [facility]. He began to have bilateral thigh pain around Monday, L>R. This has improved today somewhat. 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 He denies prior hx/o similar and works in [employer].  Past Medical History:  Diagnosis Date   Dyspnea    Known health problems: none 03/14/2021    Past Surgical History:  Procedure Laterality Date   I & D EXTREMITY Bilateral 03/16/2021   Procedure: IRRIGATION AND DEBRIDEMENT FLANKS POSSIBLE ABDOMINAL WALL;  Surgeon: Emelia LoronWakefield, Matthew, MD;  Location: [facility] OR;  Service: General;  Laterality: Bilateral;   NO PAST SURGERIES      Family History  Family history unknown: Yes    Social History:  reports that he has quit smoking. His smoking use included cigarettes. He has never been exposed to tobacco smoke. He has never used smokeless tobacco. He reports current alcohol use. He reports previous drug use. Drugs: Marijuana and Cocaine.  Allergies: No Known Allergies  Medications: I have reviewed the patient's current medications.  Results for orders placed or performed during the hospital encounter of 03/13/21 (from the past 48 hour(s))  Procalcitonin     Status: None   Collection Time: 03/18/21 12:56 AM  Result Value Ref Range   Procalcitonin 5.65 ng/mL    Comment:        Interpretation: PCT > 2 ng/mL: Systemic infection (sepsis) is likely, unless other causes are known. (NOTE)       Sepsis PCT Algorithm           Lower Respiratory Tract                                      Infection PCT Algorithm    ----------------------------     ----------------------------         PCT < 0.25  ng/mL                PCT < 0.10 ng/mL          Strongly encourage             Strongly discourage   discontinuation of antibiotics    initiation of antibiotics    ----------------------------     -----------------------------       PCT 0.25 - 0.50 ng/mL            PCT 0.10 - 0.25 ng/mL               OR       >80% decrease in PCT            Discourage initiation of                                            antibiotics      Encourage discontinuation           of antibiotics    ----------------------------     -----------------------------         PCT >= 0.50 ng/mL  PCT 0.26 - 0.50 ng/mL               AND       <80% decrease in PCT              Encourage initiation of                                             antibiotics       Encourage continuation           of antibiotics    ----------------------------     -----------------------------        PCT >= 0.50 ng/mL                  PCT > 0.50 ng/mL               AND         increase in PCT                  Strongly encourage                                      initiation of antibiotics    Strongly encourage escalation           of antibiotics                                     -----------------------------                                           PCT <= 0.25 ng/mL                                                 OR                                        > 80% decrease in PCT                                      Discontinue / Do not initiate                                             antibiotics  Performed at [facility], [address], [place], [place] [address]   CBC     Status: Abnormal   Collection Time: 03/18/21 12:56 AM  Result Value Ref Range   WBC 23.3 (H) 4.0 - 10.5 K/uL   RBC 3.24 (L) 4.22 - 5.81 MIL/uL   Hemoglobin 9.6 (L) 13.0 - 17.0 g/dL   HCT 70.0 (L) 17.4 - 94.4 %   MCV 84.0 80.0 - 100.0 fL   MCH  29.6 26.0 - 34.0 pg   MCHC 35.3 30.0 - 36.0 g/dL   RDW 16.1 09.6 - 04.5 %   Platelets 71  (L) 150 - 400 K/uL    Comment: Immature Platelet Fraction may be clinically indicated, consider ordering this additional test [id number] CONSISTENT WITH PREVIOUS RESULT REPEATED TO VERIFY    nRBC 0.2 0.0 - 0.2 %    Comment: Performed at [facility], [address] [place], [place] [address]  Protime-INR     Status: Abnormal   Collection Time: 03/18/21 12:56 AM  Result Value Ref Range   Prothrombin Time 15.3 (H) 11.4 - 15.2 seconds   INR 1.2 0.8 - 1.2    Comment: (NOTE) INR goal varies based on device and disease states. Performed at [facility], [address], [place], [place] [address]   Comprehensive metabolic panel     Status: Abnormal   Collection Time: 03/18/21 12:56 AM  Result Value Ref Range   Sodium 134 (L) 135 - 145 mmol/L   Potassium 3.4 (L) 3.5 - 5.1 mmol/L   Chloride 97 (L) 98 - 111 mmol/L   CO2 27 22 - 32 mmol/L   Glucose, Bld 143 (H) 70 - 99 mg/dL    Comment: Glucose reference range applies only to samples taken after fasting for at least 8 hours.   BUN 17 6 - 20 mg/dL   Creatinine, Ser 0.86 0.61 - 1.24 mg/dL   Calcium 7.5 (L) 8.9 - 10.3 mg/dL   Total Protein 4.7 (L) 6.5 - 8.1 g/dL   Albumin 1.5 (L) 3.5 - 5.0 g/dL   AST 43 (H) 15 - 41 U/L   ALT 31 0 - 44 U/L   Alkaline Phosphatase 177 (H) 38 - 126 U/L   Total Bilirubin 1.3 (H) 0.3 - 1.2 mg/dL   GFR, Estimated >57 >84 mL/min    Comment: (NOTE) Calculated using the CKD-EPI Creatinine Equation (2021)    Anion gap 10 5 - 15    Comment: Performed at [facility], [address] [place], [place] [address]  Hepatitis B surface antibody,quantitative     Status: Abnormal   Collection Time: 03/18/21 12:56 AM  Result Value Ref Range   Hepatitis B-Post <3.1 (L) Immunity>9.9 mIU/mL    Comment: (NOTE)  Status of Immunity                     Anti-HBs Level  ------------------                     -------------- Inconsistent with Immunity                   0.0 - 9.9 Consistent with Immunity                           >9.9 Performed At: [facility] [address] [place], [place] [id number] Jolene Schimke MD WN:[id number]   Hepatitis B surface antigen     Status: None   Collection Time: 03/18/21 12:56 AM  Result Value Ref Range   Hepatitis B Surface Ag NON REACTIVE NON REACTIVE    Comment: Performed at [facility], [address], [place], [place] [address]  Hepatitis A antibody, total     Status: Abnormal   Collection Time: 03/18/21 12:56 AM  Result Value Ref Range   hep A Total Ab Reactive (A) NON REACTIVE  Comment: Performed at [facility], [address] [place], [place] [address]  CBC     Status: Abnormal   Collection Time: 03/19/21 12:57 AM  Result Value Ref Range   WBC 16.6 (H) 4.0 - 10.5 K/uL   RBC 3.28 (L) 4.22 - 5.81 MIL/uL   Hemoglobin 9.5 (L) 13.0 - 17.0 g/dL   HCT 74.1 (L) 28.7 - 86.7 %   MCV 87.5 80.0 - 100.0 fL   MCH 29.0 26.0 - 34.0 pg   MCHC 33.1 30.0 - 36.0 g/dL   RDW 67.2 09.4 - 70.9 %   Platelets 101 (L) 150 - 400 K/uL    Comment: Immature Platelet Fraction may be clinically indicated, consider ordering this additional test [id number] CONSISTENT WITH PREVIOUS RESULT REPEATED TO VERIFY    nRBC 0.4 (H) 0.0 - 0.2 %    Comment: Performed at [facility], [address] [place], [place] [address]  Comprehensive metabolic panel     Status: Abnormal   Collection Time: 03/19/21 12:57 AM  Result Value Ref Range   Sodium 133 (L) 135 - 145 mmol/L   Potassium 4.0 3.5 - 5.1 mmol/L   Chloride 99 98 - 111 mmol/L   CO2 27 22 - 32 mmol/L   Glucose, Bld 116 (H) 70 - 99 mg/dL    Comment: Glucose reference range applies only to samples taken after fasting for at least 8 hours.   BUN 17 6 - 20 mg/dL   Creatinine, Ser 6.50 0.61 - 1.24 mg/dL   Calcium 7.7 (L) 8.9 - 10.3 mg/dL   Total Protein 4.9 (L) 6.5 - 8.1 g/dL   Albumin 1.5 (L) 3.5 - 5.0 g/dL   AST 354 (H) 15 - 41 U/L   ALT 69 (H) 0 - 44 U/L   Alkaline Phosphatase 369 (H) 38 -  126 U/L   Total Bilirubin 1.8 (H) 0.3 - 1.2 mg/dL   GFR, Estimated >65 >68 mL/min    Comment: (NOTE) Calculated using the CKD-EPI Creatinine Equation (2021)    Anion gap 7 5 - 15    Comment: Performed at [facility], [address] [place], [place] [address]  Protime-INR     Status: None   Collection Time: 03/19/21 12:57 AM  Result Value Ref Range   Prothrombin Time 14.5 11.4 - 15.2 seconds   INR 1.1 0.8 - 1.2    Comment: (NOTE) INR goal varies based on device and disease states. Performed at [facility], [address], [place], [place] [address]     MR FEMUR RIGHT W WO CONTRAST  Result Date: 03/18/2021 CLINICAL DATA:  Patient admitted 03/13/2021 with abdominal swelling, fevers and hypotension. Bilateral upper leg tenderness to palpation. EXAM: MR OF THE LEFT LOWER EXTREMITY WITHOUT AND WITH CONTRAST; MRI OF THE RIGHT FEMUR WITHOUT AND WITH CONTRAST TECHNIQUE: Multiplanar, multisequence MR imaging of the both upper legs was performed both before and after administration of intravenous contrast. CONTRAST:  6 mL GADAVIST IV SOLN COMPARISON:  None. FINDINGS: Bones/Joint/Cartilage There is no marrow edema or enhancement to suggest osteomyelitis no hip effusion is identified. No hip effusion on the right or left. Small knee joint effusions are seen, larger on the left. No fracture, stress change or worrisome lesion. Ligaments Negative. Muscles and Tendons A rim enhancing fluid collection measuring 2.6 cm craniocaudal by 1.2 cm AP by 1.3 cm transverse in the distal left vastus medialis is consistent with an abscess. The abscess is centered approximately  9.5 cm above the superior pole of the left patella. No other intramuscular abscess is identified on the right or left. There is some edema and enhancement of fascial planes in the upper legs, more notable on the left. Soft tissues Intense subcutaneous edema and enhancement present throughout consistent with cellulitis. More focal but  still somewhat ill-defined fluid in the subcutaneous tissues of the left upper leg measures approximately 11 cm craniocaudal x 3.7 cm AP x 1.2 cm transverse is worrisome for phlegmon/early abscess. This collection is in the lateral soft tissues centered approximately 23 cm above the joint line of the left knee. In the right leg, somewhat focal but it still ill-defined fluid in the lateral soft tissues measures approximately 6 cm AP by up to 2 cm transverse by 10 cm craniocaudal and extends to the joint line. IMPRESSION: Small abscess in the left vastus medialis is consistent with pyomyositis. Enhancement and edema and fascial planes of the upper legs bilaterally is consistent with fasciitis appears more notable on the left. Intense cellulitis in all imaged subcutaneous fatty tissues. More focal but ill-defined areas of fluid in both upper legs are worrisome for phlegmon/early abscess and described above. Negative for osteomyelitis. Small bilateral knee joint effusions, larger on the left, cannot be definitively characterized. Electronically Signed   By: Drusilla Kanner M.D.   On: 03/18/2021 17:14   MR FEMUR LEFT W WO CONTRAST  Result Date: 03/18/2021 CLINICAL DATA:  Patient admitted 03/13/2021 with abdominal swelling, fevers and hypotension. Bilateral upper leg tenderness to palpation. EXAM: MR OF THE LEFT LOWER EXTREMITY WITHOUT AND WITH CONTRAST; MRI OF THE RIGHT FEMUR WITHOUT AND WITH CONTRAST TECHNIQUE: Multiplanar, multisequence MR imaging of the both upper legs was performed both before and after administration of intravenous contrast. CONTRAST:  6 mL GADAVIST IV SOLN COMPARISON:  None. FINDINGS: Bones/Joint/Cartilage There is no marrow edema or enhancement to suggest osteomyelitis no hip effusion is identified. No hip effusion on the right or left. Small knee joint effusions are seen, larger on the left. No fracture, stress change or worrisome lesion. Ligaments Negative. Muscles and Tendons A rim  enhancing fluid collection measuring 2.6 cm craniocaudal by 1.2 cm AP by 1.3 cm transverse in the distal left vastus medialis is consistent with an abscess. The abscess is centered approximately 9.5 cm above the superior pole of the left patella. No other intramuscular abscess is identified on the right or left. There is some edema and enhancement of fascial planes in the upper legs, more notable on the left. Soft tissues Intense subcutaneous edema and enhancement present throughout consistent with cellulitis. More focal but still somewhat ill-defined fluid in the subcutaneous tissues of the left upper leg measures approximately 11 cm craniocaudal x 3.7 cm AP x 1.2 cm transverse is worrisome for phlegmon/early abscess. This collection is in the lateral soft tissues centered approximately 23 cm above the joint line of the left knee. In the right leg, somewhat focal but it still ill-defined fluid in the lateral soft tissues measures approximately 6 cm AP by up to 2 cm transverse by 10 cm craniocaudal and extends to the joint line. IMPRESSION: Small abscess in the left vastus medialis is consistent with pyomyositis. Enhancement and edema and fascial planes of the upper legs bilaterally is consistent with fasciitis appears more notable on the left. Intense cellulitis in all imaged subcutaneous fatty tissues. More focal but ill-defined areas of fluid in both upper legs are worrisome for phlegmon/early abscess and described above. Negative for osteomyelitis. Small  bilateral knee joint effusions, larger on the left, cannot be definitively characterized. Electronically Signed   By: Drusilla Kanner M.D.   On: 03/18/2021 17:14    Review of Systems  Constitutional:  Positive for fatigue. Negative for chills, diaphoresis and fever.  HENT:  Negative for ear discharge, ear pain, hearing loss and tinnitus.   Eyes:  Negative for photophobia and pain.  Respiratory:  Negative for cough and shortness of breath.    Cardiovascular:  Negative for chest pain.  Gastrointestinal:  Negative for abdominal pain, nausea and vomiting.  Genitourinary:  Negative for dysuria, flank pain, frequency and urgency.  Musculoskeletal:  Positive for myalgias (Bilateral thighs). Negative for back pain and neck pain.  Neurological:  Negative for dizziness and headaches.  Hematological:  Does not bruise/bleed easily.  Psychiatric/Behavioral:  The patient is not nervous/anxious.   Blood pressure (!) 108/56, pulse 97, temperature 97.7 F (36.5 C), temperature source Axillary, resp. rate 18, height 5\' 5"  (1.651 m), weight 59.3 kg, SpO2 96 %. Physical Exam Constitutional:      General: He is not in acute distress.    Appearance: He is well-developed. He is not diaphoretic.  HENT:     Head: Normocephalic and atraumatic.  Eyes:     General: No scleral icterus.       Right eye: No discharge.        Left eye: No discharge.     Conjunctiva/sclera: Conjunctivae normal.  Cardiovascular:     Rate and Rhythm: Normal rate and regular rhythm.  Pulmonary:     Effort: Pulmonary effort is normal. No respiratory distress.  Musculoskeletal:     Cervical back: Normal range of motion.     Comments: BLE No traumatic wounds, ecchymosis, or rash. Bullae formation bilateral inner proximal thigh R>L. Minimal TTP thighs.  No knee or ankle effusion  Knee stable to varus/ valgus and anterior/posterior stress  Sens DPN, SPN, TN intact  Motor EHL, ext, flex, evers 5/5  DP 2+, PT 2+, 1+ NP pedal edema, R>L  Skin:    General: Skin is warm and dry.  Neurological:     Mental Status: He is alert.  Psychiatric:        Mood and Affect: Mood normal.        Behavior: Behavior normal.    Assessment/Plan: Bilateral thigh polymyositis -- Given clinical improvement over last 24h and reassuring exam do not think we need to rush to surgery. Will monitor closely and may still yet need surgical debridement but would continue medical treatment for  now.    , PA-C Orthopedic Surgery [phone] 03/19/2021, 3:16 PM

## 2021-03-20 ENCOUNTER — Inpatient Hospital Stay (HOSPITAL_COMMUNITY): Payer: Self-pay

## 2021-03-20 LAB — COMPREHENSIVE METABOLIC PANEL
ALT: 56 U/L — ABNORMAL HIGH (ref 0–44)
AST: 65 U/L — ABNORMAL HIGH (ref 15–41)
Albumin: 1.4 g/dL — ABNORMAL LOW (ref 3.5–5.0)
Alkaline Phosphatase: 408 U/L — ABNORMAL HIGH (ref 38–126)
Anion gap: 7 (ref 5–15)
BUN: 13 mg/dL (ref 6–20)
CO2: 27 mmol/L (ref 22–32)
Calcium: 7.7 mg/dL — ABNORMAL LOW (ref 8.9–10.3)
Chloride: 97 mmol/L — ABNORMAL LOW (ref 98–111)
Creatinine, Ser: 0.73 mg/dL (ref 0.61–1.24)
GFR, Estimated: >60 mL/min (ref >60)
Glucose, Bld: 126 mg/dL — ABNORMAL HIGH (ref 70–99)
Potassium: 4.8 mmol/L (ref 3.5–5.1)
Sodium: 131 mmol/L — ABNORMAL LOW (ref 135–145)
Total Bilirubin: 1.2 mg/dL (ref 0.3–1.2)
Total Protein: 4.9 g/dL — ABNORMAL LOW (ref 6.5–8.1)

## 2021-03-20 LAB — CBC
HCT: 24.8 % — ABNORMAL LOW (ref 39.0–52.0)
Hemoglobin: 8.2 g/dL — ABNORMAL LOW (ref 13.0–17.0)
MCH: 29.2 pg (ref 26.0–34.0)
MCHC: 33.1 g/dL (ref 30.0–36.0)
MCV: 88.3 fL (ref 80.0–100.0)
Platelets: 158 10*3/uL (ref 150–400)
RBC: 2.81 MIL/uL — ABNORMAL LOW (ref 4.22–5.81)
RDW: 13.7 % (ref 11.5–15.5)
WBC: 24.2 10*3/uL — ABNORMAL HIGH (ref 4.0–10.5)
nRBC: 0.2 % (ref 0.0–0.2)

## 2021-03-20 LAB — GAMMA GT: GGT: 295 U/L — ABNORMAL HIGH (ref 7–50)

## 2021-03-20 LAB — CK: Total CK: 12 U/L — ABNORMAL LOW (ref 49–397)

## 2021-03-20 LAB — PROTIME-INR
INR: 1.2 (ref 0.8–1.2)
Prothrombin Time: 15.1 seconds (ref 11.4–15.2)

## 2021-03-20 IMAGING — US US ABDOMEN LIMITED
1 series · 14 of 25 positions shown · non-contrast
Comparison: Abdominopelvic CT [DATE]

CLINICAL DATA: All: Phosphatase elevation. Liver cell damage.
Obstruction of bile duct.

EXAM:
ULTRASOUND ABDOMEN LIMITED RIGHT UPPER QUADRANT

[Series 1: us abdomen limited ruq (liver/gb) · 14 of 48 slices shown]
[im 1/48]
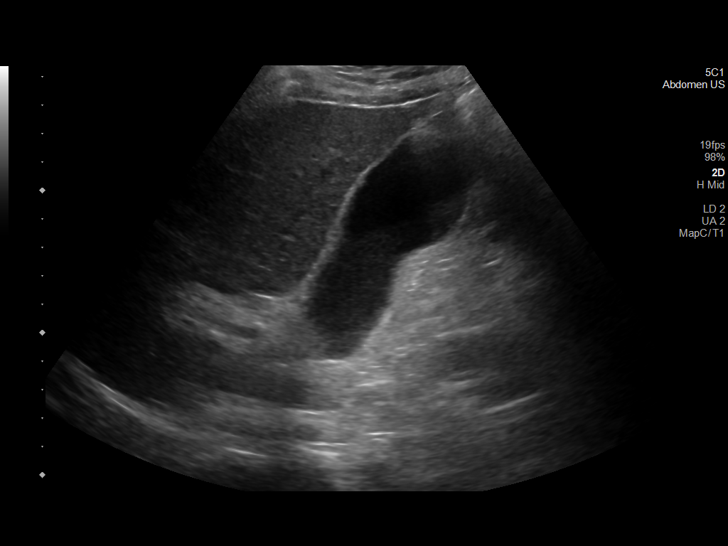
[im 4/48]
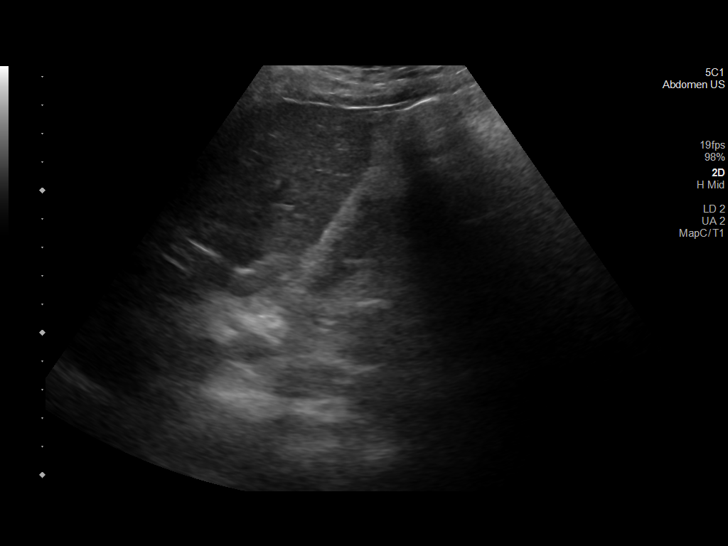
[im 8/48]
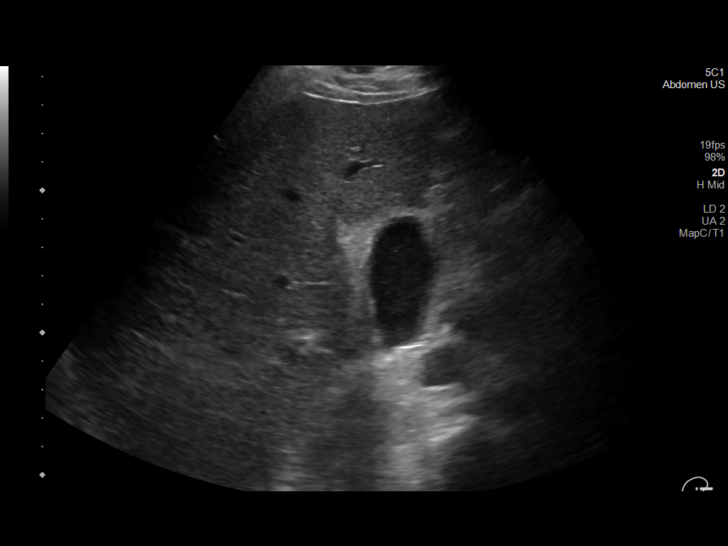
[im 12/48]
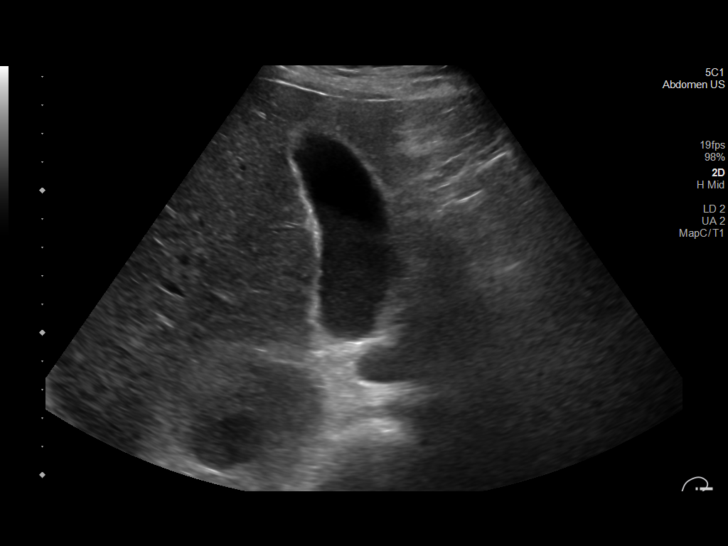
[im 16/48]
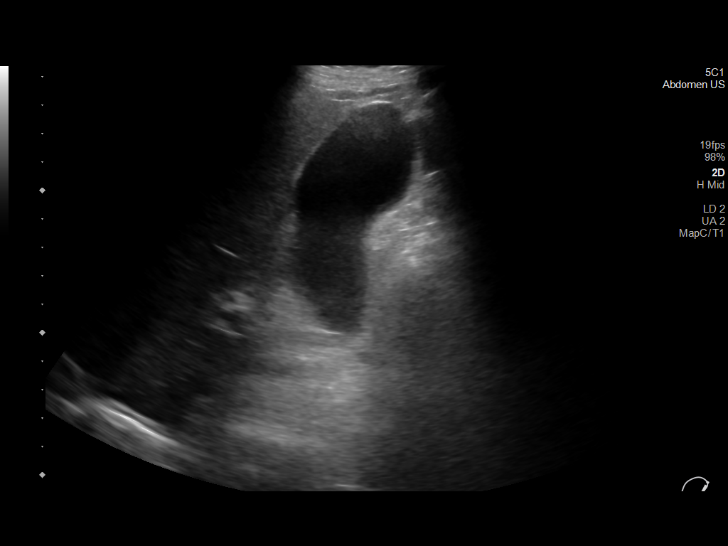
[im 18/48]
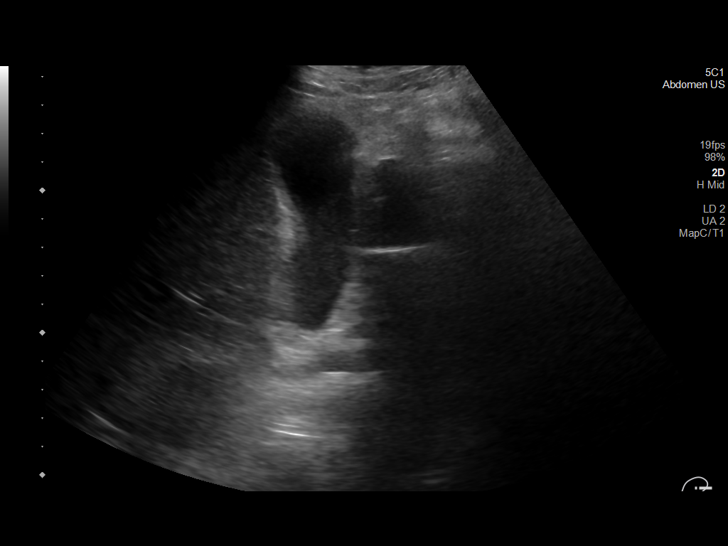
[im 22/48]
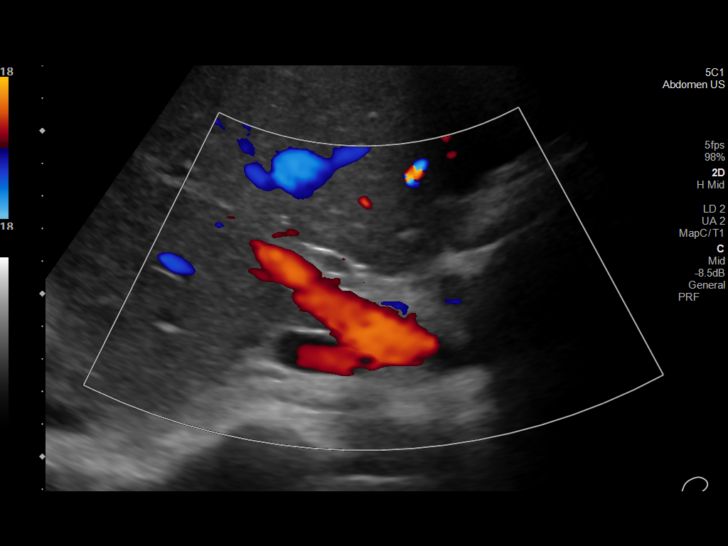
[im 26/48]
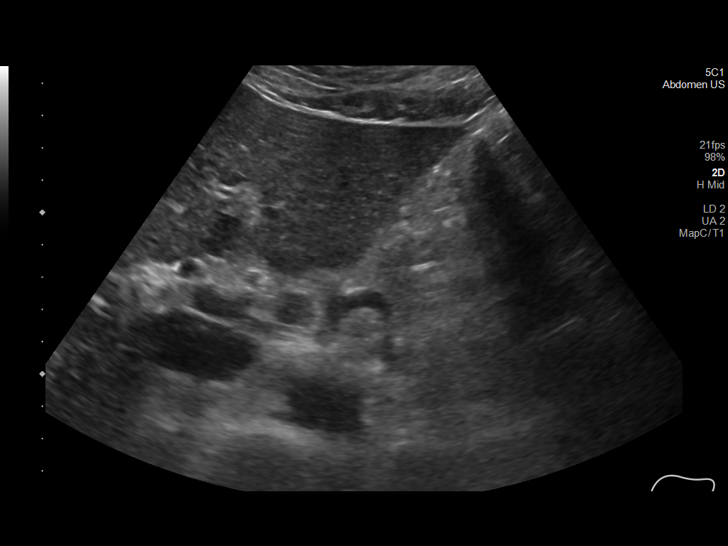
[im 30/48]
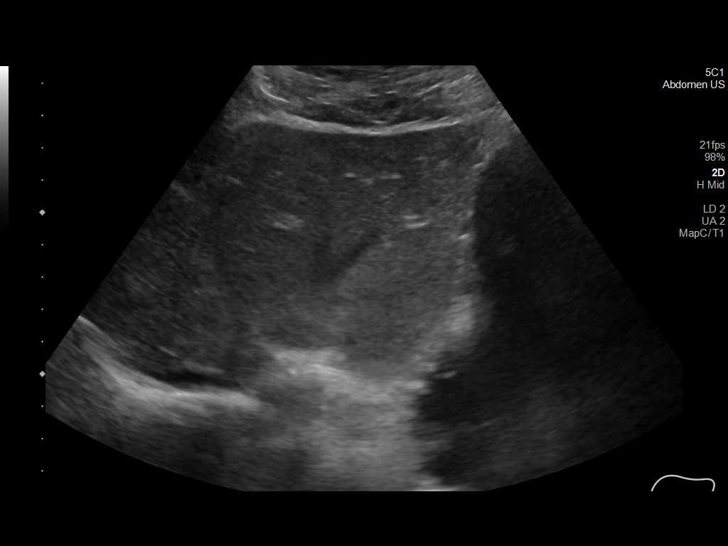
[im 32/48]
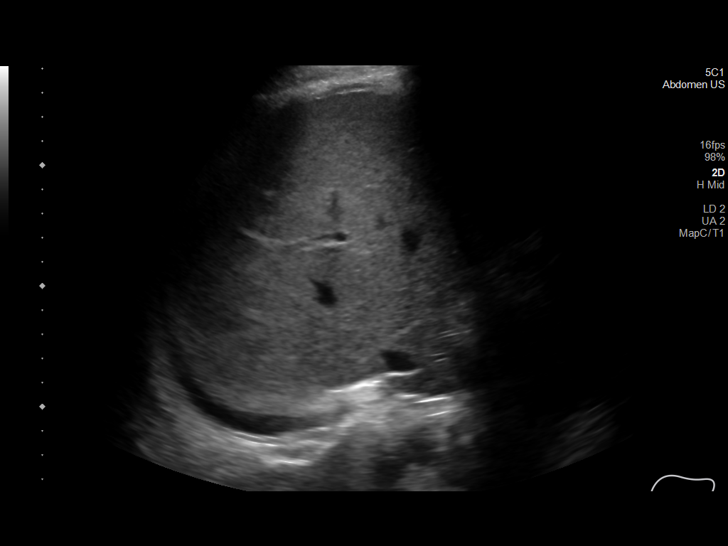
[im 36/48]
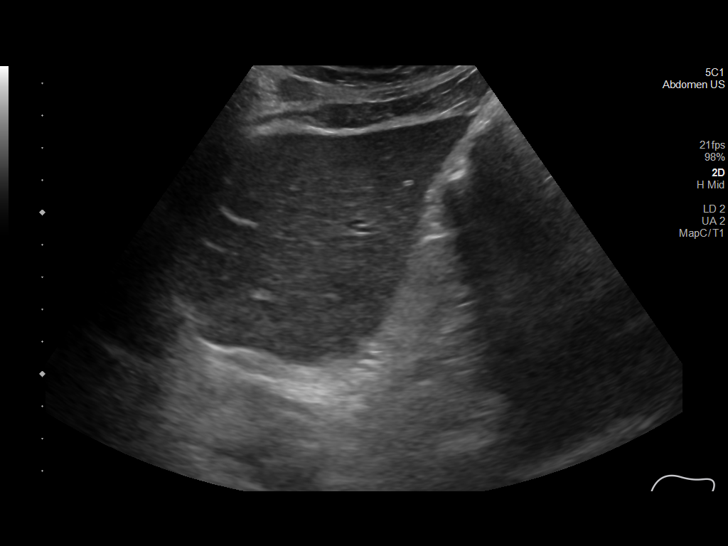
[im 40/48]
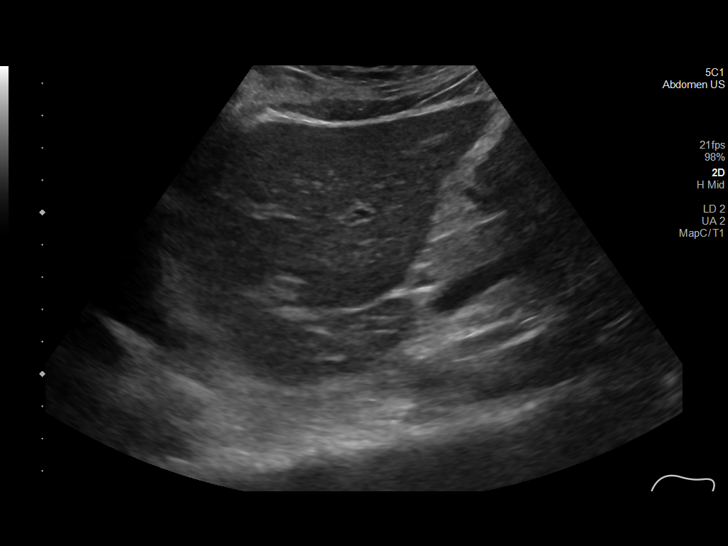
[im 44/48]
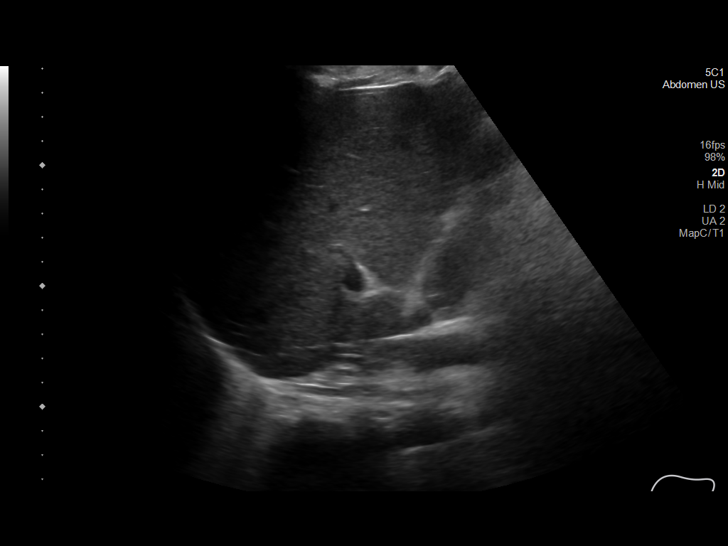
[im 48/48]
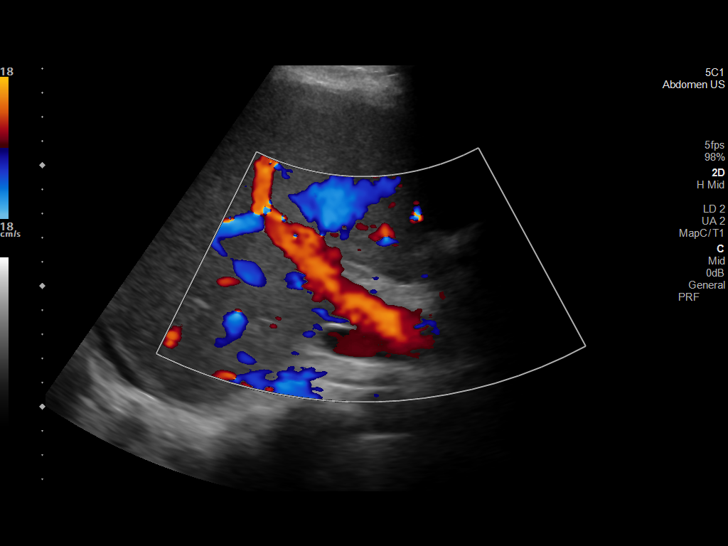

[14 of 25 positions shown; findings below may reference images not displayed]

FINDINGS: Gallbladder:

Physiologically distended. There is layering intraluminal sludge, no
gallstones. Upper normal wall thickness of 3 mm, however no
gallbladder wall edema. No pericholecystic fluid. No sonographic
Murphy sign noted by sonographer.

Common bile duct:

Diameter: 5 mm, normal.

Liver:

No focal lesion identified. Within normal limits in parenchymal
echogenicity. Portal vein is patent on color Doppler imaging with
normal direction of blood flow towards the liver.

Other: None.
IMPRESSION: 1. Gallbladder sludge. No gallstones or findings of acute
cholecystitis.
2. No biliary dilatation.
3. Unremarkable sonographic appearance of the liver.

## 2021-03-20 MED ORDER — ENOXAPARIN SODIUM 40 MG/0.4ML IJ SOSY: 40.0000 mg | PREFILLED_SYRINGE | INTRAMUSCULAR | Status: DC

## 2021-03-20 MED ORDER — HEPARIN SODIUM (PORCINE) 5000 UNIT/ML IJ SOLN: 5000.0000 [IU] | Freq: Three times a day (TID) | INTRAMUSCULAR | Status: DC

## 2021-03-20 MED ORDER — RIVAROXABAN 10 MG PO TABS
10.0000 mg | ORAL_TABLET | Freq: Every day | ORAL | Status: DC
  Filled 2021-03-20: qty 1

## 2021-03-20 NOTE — Progress Notes (Signed)
HD#7 Subjective:   Feeling ok this morning, able to move his legs a bit better today. Says that he is still having some pain in his sides, but it is about the same as yesterday  Objective:  Vital signs in last 24 hours: Vitals:   03/19/21 1952 03/19/21 1959 03/20/21 0000 03/20/21 0356  BP: 119/68 119/68 116/61 120/67  Pulse: 88 97 94 96  Resp: 16 17 17 16   Temp: 98.6 F (37 C) 98.7 F (37.1 C) 98.8 F (37.1 C) 98.7 F (37.1 C)  TempSrc: Oral Oral Oral Oral  SpO2: 99% 95% 93% 94%  Weight:      Height:       Supplemental O2: Nasal Cannula SpO2: 94 % O2 Flow Rate (L/min): 2 L/min   Physical Exam:  Constitutional: WNWD man laying in bed, in NAD  Cardiovascular: regular rate and rhythm, no m/r/g Pulmonary/Chest: normal work of breathing on room air Abd: No tenderness in the RUQ to palpation Neurological: alert & answering questions appropriately Skin: less erythema and induration overlying the central abdominal incision, dressings c/d/I . No skin changes, fluid collection, or fluctuance on the thighs noted.   Filed Weights   03/13/21 0800 03/16/21 0400  Weight: 59 kg 59.3 kg     Intake/Output Summary (Last 24 hours) at 03/20/2021 0651 Last data filed at 03/20/2021 0200 Gross per 24 hour  Intake --  Output 1500 ml  Net -1500 ml   Net IO Since Admission: -2,101.6 mL [03/20/21 0651]  Pertinent Labs: CBC Latest Ref Rng & Units 03/20/2021 03/19/2021 03/18/2021  WBC 4.0 - 10.5 K/uL 24.2(H) 16.6(H) 23.3(H)  Hemoglobin 13.0 - 17.0 g/dL 8.2(L) 9.5(L) 9.6(L)  Hematocrit 39.0 - 52.0 % 24.8(L) 28.7(L) 27.2(L)  Platelets 150 - 400 K/uL 158 101(L) 71(L)    CMP Latest Ref Rng & Units 03/20/2021 03/19/2021 03/18/2021  Glucose 70 - 99 mg/dL 03/20/2021) 144(R) 154(M)  BUN 6 - 20 mg/dL 13 17 17   Creatinine 0.61 - 1.24 mg/dL 086(P 6.19  Sodium 135 - 145 mmol/L 131(L) 133(L) 134(L)  Potassium 3.5 - 5.1 mmol/L 4.8 4.0 3.4(L)  Chloride 98 - 111 mmol/L 97(L) 99 97(L)  CO2 22 - 32  mmol/L 27 27 27   Calcium 8.9 - 10.3 mg/dL 7.7(L) 7.7(L) 7.5(L)  Total Protein 6.5 - 8.1 g/dL 4.9(L) 4.9(L) 4.7(L)  Total Bilirubin 0.3 - 1.2 mg/dL 1.2 5.09) 1.3(H)  Alkaline Phos 38 - 126 U/L 408(H) 369(H) 177(H)  AST 15 - 41 U/L 65(H) 122(H) 43(H)  ALT 0 - 44 U/L 56(H) 69(H) 31    Imaging: No results found.  Assessment/Plan:   Principal Problem:   Sepsis (HCC) Active Problems:   Abdominal wall cellulitis   Acute renal failure (HCC)   Lactic acidosis   Group A Streptococcal Bacteremia   Acute respiratory failure (HCC)   Volume overload   Hypoalbuminemia   Patient Summary: Bryce [name] is a 35 y.o. with  no known past medical history who presented with sepsis secondary to invasive group A strep.    #Sepsis 2/2 to invasive GAS SSI with evidence of necrosis #Transient hypotension: improved Patient s/p surgical debridement of his abdomen and bilateral flanks on 7/16. Antibiotics were broadened to unasyn and clindamycin on 7/18 and patient received albumin with one dose of lasix 7/17. Blood pressures continue to improve, with most readings now with  systolics running in the low 100s. Patient continues to self diurese well. - f/u cultures - ID following, appreciate assistance -  continue unasyn   #Left Thigh Pyomyositis MRI significant for small abscess in the left vastus medialis consistent with pyomyositis. Ortho consulted, may require surgical intervention but holding off at this time to due clinical improvement. - ortho following, appreciate assistance - continue Unasyn as above  #Rising Alkaline Phophatase Patient has an increasing Alkaline phosphatase to 408 with elevated AST and ALT. No RUQ tenderness on exam, but elevated GGT suggests a hepatic origin. Total CK is low at 12. Will order a RUQ ultrasound to assess for biliary obstruction. -F/u RUQ -Daily CMP   #Normocytic Anemia: #Thrombocytopenia: resolved S/p transfusion of 1 unit platelets, platelets  normalized on 7/20. Hemoglobin slowing dropping, thought to be secondary to oozing, may require a transfusion if he requires more surgical intervention. CTM. -daily CBC -transfuse <7   #Hyponatremia: Stable -daily BMP   Prior to Admission Living Arrangement: Home Anticipated Discharge Location: Home Barriers to Discharge: Continued medical management Dispo: Anticipated discharge in 2-3 days  [name], MD Internal Medicine Resident PGY-1 Pager [phone] Please contact the on call pager after 5 pm and on weekends at [phone].
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
## 2021-03-21 DIAGNOSIS — J9600 Acute respiratory failure, unspecified whether with hypoxia or hypercapnia: Secondary | ICD-10-CM

## 2021-03-21 DIAGNOSIS — M60052 Infective myositis, left thigh: Secondary | ICD-10-CM

## 2021-03-21 LAB — COMPREHENSIVE METABOLIC PANEL
ALT: 39 U/L (ref 0–44)
AST: 37 U/L (ref 15–41)
Albumin: 1.4 g/dL — ABNORMAL LOW (ref 3.5–5.0)
Alkaline Phosphatase: 324 U/L — ABNORMAL HIGH (ref 38–126)
Anion gap: 7 (ref 5–15)
BUN: 15 mg/dL (ref 6–20)
CO2: 24 mmol/L (ref 22–32)
Calcium: 7.7 mg/dL — ABNORMAL LOW (ref 8.9–10.3)
Chloride: 99 mmol/L (ref 98–111)
Creatinine, Ser: 0.63 mg/dL (ref 0.61–1.24)
GFR, Estimated: >60 mL/min (ref >60)
Glucose, Bld: 112 mg/dL — ABNORMAL HIGH (ref 70–99)
Potassium: 4.3 mmol/L (ref 3.5–5.1)
Sodium: 130 mmol/L — ABNORMAL LOW (ref 135–145)
Total Bilirubin: 0.8 mg/dL (ref 0.3–1.2)
Total Protein: 5 g/dL — ABNORMAL LOW (ref 6.5–8.1)

## 2021-03-21 LAB — CBC
HCT: 22.3 % — ABNORMAL LOW (ref 39.0–52.0)
Hemoglobin: 7.5 g/dL — ABNORMAL LOW (ref 13.0–17.0)
MCH: 29.4 pg (ref 26.0–34.0)
MCHC: 33.6 g/dL (ref 30.0–36.0)
MCV: 87.5 fL (ref 80.0–100.0)
Platelets: 221 10*3/uL (ref 150–400)
RBC: 2.55 MIL/uL — ABNORMAL LOW (ref 4.22–5.81)
RDW: 13.2 % (ref 11.5–15.5)
WBC: 22.2 10*3/uL — ABNORMAL HIGH (ref 4.0–10.5)
nRBC: 0.1 % (ref 0.0–0.2)

## 2021-03-21 MED ORDER — AMPICILLIN-SULBACTAM SODIUM 3 (2-1) G IJ SOLR
3.0000 g | Freq: Four times a day (QID) | INTRAMUSCULAR | Status: AC
  Administered 2021-03-21: 3 g via INTRAVENOUS
  Filled 2021-03-21: qty 3

## 2021-03-21 MED ORDER — AMOXICILLIN-POT CLAVULANATE 875-125 MG PO TABS
1.0000 | ORAL_TABLET | Freq: Two times a day (BID) | ORAL | Status: DC
  Administered 2021-03-22 – 2021-03-23 (×3): 1 via ORAL
  Filled 2021-03-21 (×3): qty 1

## 2021-03-21 MED ORDER — SODIUM CHLORIDE 0.9 % IV SOLN: INTRAVENOUS | Status: DC | PRN

## 2021-03-21 NOTE — Progress Notes (Signed)
HD#8 Subjective:  Spoke with patient using interpreter services at beside [phone])   Feeling more normal this morning, says that his pain continues to improve. Continues to update his family about how he is doing and will let the team know if he wants them to talk to his family.   Objective:  Vital signs in last 24 hours: Vitals:   03/20/21 1638 03/20/21 2033 03/20/21 2245 03/21/21 0349  BP: 127/60 122/61 (!) 111/55 110/60  Pulse: 96 93 87 80  Resp: $Remo'20 20 18 15  'Gwjzw$ Temp: 97.8 F (36.6 C) 98 F (36.7 C) 98.3 F (36.8 C) 98 F (36.7 C)  TempSrc: Oral Axillary Axillary Axillary  SpO2: 97% 92% 95% 93%  Weight:      Height:       Supplemental O2: Room Air SpO2: 93 % O2 Flow Rate (L/min): 2 L/min   Physical Exam:  Constitutional: WNWD man laying in bed, in NAD Cardiovascular: regular rate and rhythm, no m/r/g Pulmonary/Chest: normal work of breathing on room air Abd: No tenderness in the RUQ to palpation Neurological: alert & answering questions appropriately Skin: less erythema and induration overlying the central abdominal incision, dressings c/d/I . No skin changes, fluid collection, or fluctuance on the thighs noted.   Filed Weights   03/13/21 0800 03/16/21 0400  Weight: 59 kg 59.3 kg     Intake/Output Summary (Last 24 hours) at 03/21/2021 8889 Last data filed at 03/21/2021 0400 Gross per 24 hour  Intake 1177.28 ml  Output 2200 ml  Net -1022.72 ml   Net IO Since Admission: -3,124.32 mL [03/21/21 0638]  Pertinent Labs: CBC Latest Ref Rng & Units 03/21/2021 03/20/2021 03/19/2021  WBC 4.0 - 10.5 K/uL 22.2(H) 24.2(H) 16.6(H)  Hemoglobin 13.0 - 17.0 g/dL 7.5(L) 8.2(L) 9.5(L)  Hematocrit 39.0 - 52.0 % 22.3(L) 24.8(L) 28.7(L)  Platelets 150 - 400 K/uL 221 158 101(L)    CMP Latest Ref Rng & Units 03/21/2021 03/20/2021 03/19/2021  Glucose 70 - 99 mg/dL 112(H) 126(H) 116(H)  BUN 6 - 20 mg/dL $Remove'15 13 17  'lEuOJcu$ Creatinine 0.61 - 1.24 mg/dL 0.63 0.73 0.74  Sodium 135 - 145 mmol/L  130(L) 131(L) 133(L)  Potassium 3.5 - 5.1 mmol/L 4.3 4.8 4.0  Chloride 98 - 111 mmol/L 99 97(L) 99  CO2 22 - 32 mmol/L $RemoveB'24 27 27  'RGvNvRBQ$ Calcium 8.9 - 10.3 mg/dL 7.7(L) 7.7(L) 7.7(L)  Total Protein 6.5 - 8.1 g/dL 5.0(L) 4.9(L) 4.9(L)  Total Bilirubin 0.3 - 1.2 mg/dL 0.8 1.2 1.8(H)  Alkaline Phos 38 - 126 U/L 324(H) 408(H) 369(H)  AST 15 - 41 U/L 37 65(H) 122(H)  ALT 0 - 44 U/L 39 56(H) 69(H)    Imaging: US Abdomen Limited RUQ (LIVER/GB)  Result Date: 03/20/2021 CLINICAL DATA:  All: Phosphatase elevation. Liver cell damage. Obstruction of bile duct. EXAM: ULTRASOUND ABDOMEN LIMITED RIGHT UPPER QUADRANT COMPARISON:  Abdominopelvic CT 03/14/2021 FINDINGS: Gallbladder: Physiologically distended. There is layering intraluminal sludge, no gallstones. Upper normal wall thickness of 3 mm, however no gallbladder wall edema. No pericholecystic fluid. No sonographic Murphy sign noted by sonographer. Common bile duct: Diameter: 5 mm, normal. Liver: No focal lesion identified. Within normal limits in parenchymal echogenicity. Portal vein is patent on color Doppler imaging with normal direction of blood flow towards the liver. Other: None. IMPRESSION: 1. Gallbladder sludge. No gallstones or findings of acute cholecystitis. 2. No biliary dilatation. 3. Unremarkable sonographic appearance of the liver. Electronically Signed   By: Keith Rake M.D.   On: 03/20/2021  20:03    Assessment/Plan:   Principal Problem:   Sepsis ([place]) Active Problems:   Abdominal wall cellulitis   Acute renal failure (HCC)   Lactic acidosis   Group A Streptococcal Bacteremia   Acute respiratory failure (HCC)   Volume overload   Hypoalbuminemia   Patient Summary: Bryce [name] is a 35 y.o. with  no known past medical history who presented with sepsis secondary to invasive group A strep.    #Sepsis 2/2 to invasive GAS SSI with evidence of necrosis #Transient hypotension: improved Patient s/p surgical debridement of his  abdomen and bilateral flanks on 7/16. Patient's pain, blood pressures, and clinical pictures  - f/u cultures - ID following will discuss when to d/c or narrow antibiotics, appreciate assistance - continue unasyn   #Left Thigh Pyomyositis MRI significant for small abscess in the left vastus medialis consistent with pyomyositis. Ortho consulted, may require surgical intervention but holding off at this time to due clinical improvement. - ortho following, appreciate assistance - continue Unasyn as above   #Biliary Sludge RUQ showing biliary sludge new from prior CT. Likely due to acute illness. Alk phos has come down a little today to 324 from 408. Expect this to improve as his overall clinical picture improves.   #Normocytic Anemia: #Thrombocytopenia: resolved S/p transfusion of 1 unit platelets, platelets normalized on 7/20. Hemoglobin slowing dropping, thought to be secondary to oozing, may require a transfusion if he requires more surgical intervention. CTM. Hgb 7.5 on 7/21. -daily CBC -transfuse <7   #Hyponatremia: Stable -daily BMP    Prior to Admission Living Arrangement: Home Anticipated Discharge Location: Home Barriers to Discharge: Continued medical management Dispo: Anticipated discharge in 2-3 days    Scarlett Presto, MD Internal Medicine Resident PGY-1 Pager [phone] Please contact the on call pager after 5 pm and on weekends at [phone].
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
## 2021-03-21 NOTE — Progress Notes (Signed)
   ORTHOPAEDIC PROGRESS NOTE  - Bilateral thigh polymyositis  SUBJECTIVE: Patient not having any pain in the lower legs. Feels better than before.   OBJECTIVE: PE: General: NAD BLE: Non-tender to palpation bilateral thighs. Healing bullae inner thighs. No drainage. No fluid collection or fluctuance noted. Actively moves lower extremity without pain. Non-tender to palpation hip, knee, ankle, foot. NVI.   Vitals:   03/20/21 2245 03/21/21 0349  BP: (!) 111/55 110/60  Pulse: 87 80  Resp: 18 15  Temp: 98.3 F (36.8 C) 98 F (36.7 C)  SpO2: 95% 93%   MRI's were obtained that showed polymyositis and fasciitis bilaterally with a small abscess on left  ASSESSMENT: Bryce [name] is a 35 y.o. male  - bilateral thigh polymyositis  PLAN: Patient has made significant improvement over the last 2 days with IV antibiotics. No need for surgical debridement. Continue with medical treatment at this time. Orthopedics to sign off. Will be available for any questions or concerns.  Alfonse Alpers, PA-C 03/21/2021

## 2021-03-22 ENCOUNTER — Other Ambulatory Visit (HOSPITAL_COMMUNITY): Payer: Self-pay

## 2021-03-22 DIAGNOSIS — M60009 Infective myositis, unspecified site: Secondary | ICD-10-CM

## 2021-03-22 DIAGNOSIS — R6521 Severe sepsis with septic shock: Secondary | ICD-10-CM

## 2021-03-22 DIAGNOSIS — N170 Acute kidney failure with tubular necrosis: Secondary | ICD-10-CM

## 2021-03-22 LAB — ANAEROBIC CULTURE W GRAM STAIN

## 2021-03-22 LAB — CBC
HCT: 21.8 % — ABNORMAL LOW (ref 39.0–52.0)
Hemoglobin: 7.2 g/dL — ABNORMAL LOW (ref 13.0–17.0)
MCH: 29.4 pg (ref 26.0–34.0)
MCHC: 33 g/dL (ref 30.0–36.0)
MCV: 89 fL (ref 80.0–100.0)
Platelets: 324 10*3/uL (ref 150–400)
RBC: 2.45 MIL/uL — ABNORMAL LOW (ref 4.22–5.81)
RDW: 13.2 % (ref 11.5–15.5)
WBC: 19 10*3/uL — ABNORMAL HIGH (ref 4.0–10.5)
nRBC: 0.1 % (ref 0.0–0.2)

## 2021-03-22 LAB — MRSA NEXT GEN BY PCR, NASAL: MRSA by PCR Next Gen: NOT DETECTED

## 2021-03-22 LAB — COMPREHENSIVE METABOLIC PANEL
ALT: 36 U/L (ref 0–44)
AST: 39 U/L (ref 15–41)
Albumin: 1.5 g/dL — ABNORMAL LOW (ref 3.5–5.0)
Alkaline Phosphatase: 344 U/L — ABNORMAL HIGH (ref 38–126)
Anion gap: 7 (ref 5–15)
BUN: 17 mg/dL (ref 6–20)
CO2: 25 mmol/L (ref 22–32)
Calcium: 8 mg/dL — ABNORMAL LOW (ref 8.9–10.3)
Chloride: 99 mmol/L (ref 98–111)
Creatinine, Ser: 0.71 mg/dL (ref 0.61–1.24)
GFR, Estimated: >60 mL/min (ref >60)
Glucose, Bld: 94 mg/dL (ref 70–99)
Potassium: 4.1 mmol/L (ref 3.5–5.1)
Sodium: 131 mmol/L — ABNORMAL LOW (ref 135–145)
Total Bilirubin: 0.5 mg/dL (ref 0.3–1.2)
Total Protein: 5.3 g/dL — ABNORMAL LOW (ref 6.5–8.1)

## 2021-03-22 MED ORDER — OXYCODONE HCL 5 MG PO TABS
5.0000 mg | ORAL_TABLET | Freq: Four times a day (QID) | ORAL | Status: DC | PRN
  Administered 2021-03-22: 10 mg via ORAL
  Administered 2021-03-23: 5 mg via ORAL
  Filled 2021-03-22: qty 2
  Filled 2021-03-22: qty 1

## 2021-03-22 MED ORDER — OXYCODONE HCL 5 MG PO TABS: 5.0000 mg | ORAL_TABLET | Freq: Four times a day (QID) | ORAL | Status: DC | PRN

## 2021-03-22 MED ORDER — ENSURE ENLIVE PO LIQD
237.0000 mL | Freq: Every day | ORAL | 12 refills | Status: DC
  Filled 2021-03-22: qty 237, 1d supply, fill #0

## 2021-03-22 MED ORDER — AMOXICILLIN-POT CLAVULANATE 875-125 MG PO TABS
1.0000 | ORAL_TABLET | Freq: Two times a day (BID) | ORAL | 0 refills | Status: DC
  Filled 2021-03-22: qty 28, 14d supply, fill #0

## 2021-03-22 MED ORDER — HYDROCODONE-ACETAMINOPHEN 5-325 MG PO TABS
1.0000 | ORAL_TABLET | ORAL | 0 refills | Status: DC | PRN
  Filled 2021-03-22: qty 20, 2d supply, fill #0

## 2021-03-22 MED ORDER — CERTAVITE/ANTIOXIDANTS PO TABS
1.0000 | ORAL_TABLET | Freq: Every day | ORAL | 0 refills | Status: DC
  Filled 2021-03-22: qty 30, 30d supply, fill #0

## 2021-03-22 NOTE — Progress Notes (Signed)
  HD#9 Subjective:    Patient feeling pretty good this morning. Not have too much pain. Understands that he will be switched to oral antibiotics today and does not have any questions.   Objective:  Vital signs in last 24 hours: Vitals:   03/21/21 2320 03/21/21 2337 03/22/21 0401 03/22/21 0740  BP: 105/60  109/60 (!) 105/54  Pulse: 88 86 89 81  Resp: 20 16 17 14  Temp: 98.7 F (37.1 C)  98.7 F (37.1 C) 98.6 F (37 C)  TempSrc: Oral  Oral Oral  SpO2: 97% 97% 96% 98%  Weight:      Height:       Supplemental O2: Nasal Cannula SpO2: 98 % O2 Flow Rate (L/min): 2 L/min   Physical Exam:  Constitutional: WNWD man laying in bed, in NAD Pulmonary/Chest: normal work of breathing on 2L Neurological: alert & answering questions appropriately Skin: improved induration overlying the central abdominal incision, dressings c/d/I . No skin changes, fluid collection, or fluctuance on the thighs noted.     Filed Weights   03/13/21 0800 03/16/21 0400  Weight: 59 kg 59.3 kg     Intake/Output Summary (Last 24 hours) at 03/22/2021 1109 Last data filed at 03/22/2021 0741 Gross per 24 hour  Intake 783.83 ml  Output 2055 ml  Net -1271.17 ml   Net IO Since Admission: -4,645.49 mL [03/22/21 1109]  Pertinent Labs: CBC Latest Ref Rng & Units 03/22/2021 03/21/2021 03/20/2021  WBC 4.0 - 10.5 K/uL 19.0(H) 22.2(H) 24.2(H)  Hemoglobin 13.0 - 17.0 g/dL 7.2(L) 7.5(L) 8.2(L)  Hematocrit 39.0 - 52.0 % 21.8(L) 22.3(L) 24.8(L)  Platelets 150 - 400 K/uL 324 221 158    CMP Latest Ref Rng & Units 03/22/2021 03/21/2021 03/20/2021  Glucose 70 - 99 mg/dL 94 112(H) 126(H)  BUN 6 - 20 mg/dL 17 15 13  Creatinine 0.61 - 1.24 mg/dL 0.71 0.63 0.73  Sodium 135 - 145 mmol/L 131(L) 130(L) 131(L)  Potassium 3.5 - 5.1 mmol/L 4.1 4.3 4.8  Chloride 98 - 111 mmol/L 99 99 97(L)  CO2 22 - 32 mmol/L 25 24 27  Calcium 8.9 - 10.3 mg/dL 8.0(L) 7.7(L) 7.7(L)  Total Protein 6.5 - 8.1 g/dL 5.3(L) 5.0(L) 4.9(L)  Total  Bilirubin 0.3 - 1.2 mg/dL 0.5 0.8 1.2  Alkaline Phos 38 - 126 U/L 344(H) 324(H) 408(H)  AST 15 - 41 U/L 39 37 65(H)  ALT 0 - 44 U/L 36 39 56(H)    Imaging: No results found.  Assessment/Plan:   Principal Problem:   Sepsis (HCC) Active Problems:   Abdominal wall cellulitis   Acute renal failure (HCC)   Lactic acidosis   Group A Streptococcal Bacteremia   Acute respiratory failure (HCC)   Volume overload   Hypoalbuminemia   Patient Summary: Bryce [name] is a 34 y.o. with a no known past medical history who presented with sepsis secondary to invasive group A strep.    #Sepsis 2/2 to invasive GAS SSI with evidence of necrosis #Transient hypotension: improved Patient s/p surgical debridement of his abdomen and bilateral flanks on 7/16. Patient's pain, blood pressures, and clinical picture continues to improve. Ready to transition to PO antibiotics 7/22, will D/c Unasyn and start augmentin. Working on PT/OT evaluation to determine patient's ability to meet his ADLs. - Patient will follow with ID as outpatient, continue augmentin until follow up - no further surgical intervention, will follow up with wound care  #Left Thigh Pyomyositis MRI significant for small abscess in the left vastus   medialis consistent with pyomyositis. Ortho consulted, no surgical intervention, clinical picture has improved on antibiotics - continue antibiotics as above   #Biliary Sludge RUQ showing biliary sludge new from prior CT. Likely due to acute illness. Alk phos has come down a little today to 324 from 408. Expect this to improve as his overall clinical picture improves.    #Normocytic Anemia: #Thrombocytopenia: resolved S/p transfusion of 1 unit platelets, platelets normalized on 7/20. Hemoglobin slowing dropping, thought to be secondary to oozing, may require a transfusion if he requires more surgical intervention. CTM.  -daily CBC -transfuse <7   #Hyponatremia: Stable -daily  BMP   Prior to Admission Living Arrangement: Home Anticipated Discharge Location: Home Barriers to Discharge: None Dispo: Anticipated discharge in 1-2 days    Scarlett Presto, MD Internal Medicine Resident PGY-1 Pager [phone] Please contact the on call pager after 5 pm and on weekends at [phone].
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
## 2021-03-22 NOTE — TOC Progression Note (Signed)
Transition of Care [facility]) - Progression Note    Patient Details  Name: Bryce [name] MRN: [id number] Date of Birth: [birth year]  Transition of Care [facility]) CM/SW Contact  Kermit Balo, RN Phone Number: 03/22/2021, 1:50 PM  Clinical Narrative:    IM has picked him up in their clinic.  CM has asked bedside RN to make sure pt knows how to do dressing changes and has supplies for home.  Awaiting PT/OT evals.  Medications sent to [facility] and [facility] provided.  Pt should have transport home when medically ready.   Expected Discharge Plan: Home/Self Care Barriers to Discharge: Continued Medical Work up  Expected Discharge Plan and Services Expected Discharge Plan: Home/Self Care                                               Social Determinants of Health (SDOH) Interventions    Readmission Risk Interventions No flowsheet data found.
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
## 2021-03-23 LAB — IRON AND TIBC
Iron: 16 ug/dL — ABNORMAL LOW (ref 45–182)
Saturation Ratios: 6 % — ABNORMAL LOW (ref 17.9–39.5)
TIBC: 248 ug/dL — ABNORMAL LOW (ref 250–450)
UIBC: 232 ug/dL

## 2021-03-23 LAB — COMPREHENSIVE METABOLIC PANEL
ALT: 37 U/L (ref 0–44)
AST: 47 U/L — ABNORMAL HIGH (ref 15–41)
Albumin: 1.7 g/dL — ABNORMAL LOW (ref 3.5–5.0)
Alkaline Phosphatase: 375 U/L — ABNORMAL HIGH (ref 38–126)
Anion gap: 9 (ref 5–15)
BUN: 18 mg/dL (ref 6–20)
CO2: 22 mmol/L (ref 22–32)
Calcium: 8 mg/dL — ABNORMAL LOW (ref 8.9–10.3)
Chloride: 98 mmol/L (ref 98–111)
Creatinine, Ser: 0.76 mg/dL (ref 0.61–1.24)
GFR, Estimated: >60 mL/min (ref >60)
Glucose, Bld: 99 mg/dL (ref 70–99)
Potassium: 4 mmol/L (ref 3.5–5.1)
Sodium: 129 mmol/L — ABNORMAL LOW (ref 135–145)
Total Bilirubin: 0.6 mg/dL (ref 0.3–1.2)
Total Protein: 5.9 g/dL — ABNORMAL LOW (ref 6.5–8.1)

## 2021-03-23 LAB — CBC
HCT: 21.9 % — ABNORMAL LOW (ref 39.0–52.0)
Hemoglobin: 7.2 g/dL — ABNORMAL LOW (ref 13.0–17.0)
MCH: 29 pg (ref 26.0–34.0)
MCHC: 32.9 g/dL (ref 30.0–36.0)
MCV: 88.3 fL (ref 80.0–100.0)
Platelets: 477 10*3/uL — ABNORMAL HIGH (ref 150–400)
RBC: 2.48 MIL/uL — ABNORMAL LOW (ref 4.22–5.81)
RDW: 13.2 % (ref 11.5–15.5)
WBC: 21.9 10*3/uL — ABNORMAL HIGH (ref 4.0–10.5)
nRBC: 0.1 % (ref 0.0–0.2)

## 2021-03-23 LAB — FERRITIN: Ferritin: 431 ng/mL — ABNORMAL HIGH (ref 24–336)

## 2021-03-23 NOTE — Progress Notes (Signed)
Dressings changed at this time. Patient education provided via interpreter for discharge instructions in regards to how to change the dressings. Patient supplied with dressing change supplies. Patient verbalized understanding of instruction, and all questions answered at this time.
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
## 2021-03-23 NOTE — Progress Notes (Signed)
Discharge instructions and medications reviewed in detail with patient via the interpreter at this time. Patient verbalized understanding of all follow up appointments, wound care, and medications. Denied any further questions. IV site removed, catheter intact, pressure dressing applied. Patient discharged with wound care supplies to start care at home. All patient belongings in disposition of the patient. No further needs at this time. Discharged home

## 2021-03-23 NOTE — Plan of Care (Signed)
  Problem: Education: Goal: Knowledge of General Education information will improve Description: Including pain rating scale, medication(s)/side effects and non-pharmacologic comfort measures Outcome: Adequate for Discharge   Problem: Health Behavior/Discharge Planning: Goal: Ability to manage health-related needs will improve Outcome: Adequate for Discharge   Problem: Clinical Measurements: Goal: Ability to maintain clinical measurements within normal limits will improve Outcome: Adequate for Discharge Goal: Will remain free from infection Outcome: Adequate for Discharge Goal: Diagnostic test results will improve Outcome: Adequate for Discharge Goal: Respiratory complications will improve Outcome: Adequate for Discharge Goal: Cardiovascular complication will be avoided Outcome: Adequate for Discharge   Problem: Activity: Goal: Risk for activity intolerance will decrease Outcome: Adequate for Discharge   Problem: Nutrition: Goal: Adequate nutrition will be maintained Outcome: Adequate for Discharge   Problem: Coping: Goal: Level of anxiety will decrease Outcome: Adequate for Discharge   Problem: Elimination: Goal: Will not experience complications related to bowel motility Outcome: Adequate for Discharge Goal: Will not experience complications related to urinary retention Outcome: Adequate for Discharge   Problem: Pain Managment: Goal: General experience of comfort will improve Outcome: Adequate for Discharge   Problem: Safety: Goal: Ability to remain free from injury will improve Outcome: Adequate for Discharge   Problem: Skin Integrity: Goal: Risk for impaired skin integrity will decrease Outcome: Adequate for Discharge   Problem: Clinical Measurements: Goal: Ability to avoid or minimize complications of infection will improve Outcome: Adequate for Discharge   Problem: Skin Integrity: Goal: Skin integrity will improve Outcome: Adequate for Discharge    Problem: Fluid Volume: Goal: Hemodynamic stability will improve Outcome: Adequate for Discharge   Problem: Clinical Measurements: Goal: Diagnostic test results will improve Outcome: Adequate for Discharge Goal: Signs and symptoms of infection will decrease Outcome: Adequate for Discharge   Problem: Respiratory: Goal: Ability to maintain adequate ventilation will improve Outcome: Adequate for Discharge   Problem: Education: Goal: Knowledge of disease and its progression will improve Outcome: Adequate for Discharge   Problem: Health Behavior/Discharge Planning: Goal: Ability to manage health-related needs will improve Outcome: Adequate for Discharge   Problem: Clinical Measurements: Goal: Complications related to the disease process or treatment will be avoided or minimized Outcome: Adequate for Discharge   Problem: Activity: Goal: Activity intolerance will improve Outcome: Adequate for Discharge   Problem: Fluid Volume: Goal: Fluid volume balance will be maintained or improved Outcome: Adequate for Discharge   Problem: Nutritional: Goal: Ability to make appropriate dietary choices will improve Outcome: Adequate for Discharge   Problem: Respiratory: Goal: Respiratory symptoms related to disease process will be avoided Outcome: Adequate for Discharge   Problem: Urinary Elimination: Goal: Progression of disease will be identified and treated Outcome: Adequate for Discharge
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
## 2021-03-25 ENCOUNTER — Other Ambulatory Visit (HOSPITAL_COMMUNITY): Payer: Self-pay

## 2021-03-28 ENCOUNTER — Encounter: Payer: Self-pay | Admitting: Internal Medicine

## 2021-03-29 ENCOUNTER — Encounter (HOSPITAL_COMMUNITY): Payer: Self-pay

## 2021-03-29 ENCOUNTER — Other Ambulatory Visit: Payer: Self-pay

## 2021-03-29 ENCOUNTER — Inpatient Hospital Stay (HOSPITAL_COMMUNITY)
Admission: EM | Admit: 2021-03-29 | Discharge: 2021-04-07 | DRG: 501 | Disposition: A | Discharge status: Home / Self Care | Payer: Self-pay | Attending: Student in an Organized Health Care Education/Training Program | Admitting: Student in an Organized Health Care Education/Training Program

## 2021-03-29 DIAGNOSIS — L02416 Cutaneous abscess of left lower limb: Secondary | ICD-10-CM | POA: Diagnosis present

## 2021-03-29 DIAGNOSIS — Z20822 Contact with and (suspected) exposure to covid-19: Secondary | ICD-10-CM | POA: Diagnosis present

## 2021-03-29 DIAGNOSIS — D75838 Other thrombocytosis: Secondary | ICD-10-CM | POA: Diagnosis present

## 2021-03-29 DIAGNOSIS — M60052 Infective myositis, left thigh: Principal | ICD-10-CM | POA: Diagnosis present

## 2021-03-29 DIAGNOSIS — L089 Local infection of the skin and subcutaneous tissue, unspecified: Secondary | ICD-10-CM

## 2021-03-29 DIAGNOSIS — Z87891 Personal history of nicotine dependence: Secondary | ICD-10-CM

## 2021-03-29 DIAGNOSIS — H1132 Conjunctival hemorrhage, left eye: Secondary | ICD-10-CM | POA: Diagnosis present

## 2021-03-29 DIAGNOSIS — B950 Streptococcus, group A, as the cause of diseases classified elsewhere: Secondary | ICD-10-CM | POA: Diagnosis present

## 2021-03-29 DIAGNOSIS — B954 Other streptococcus as the cause of diseases classified elsewhere: Secondary | ICD-10-CM | POA: Diagnosis present

## 2021-03-29 DIAGNOSIS — A491 Streptococcal infection, unspecified site: Secondary | ICD-10-CM | POA: Diagnosis present

## 2021-03-29 DIAGNOSIS — D509 Iron deficiency anemia, unspecified: Secondary | ICD-10-CM | POA: Diagnosis present

## 2021-03-29 DIAGNOSIS — S31109A Unspecified open wound of abdominal wall, unspecified quadrant without penetration into peritoneal cavity, initial encounter: Secondary | ICD-10-CM | POA: Diagnosis present

## 2021-03-29 DIAGNOSIS — R748 Abnormal levels of other serum enzymes: Secondary | ICD-10-CM | POA: Diagnosis present

## 2021-03-29 DIAGNOSIS — L03311 Cellulitis of abdominal wall: Secondary | ICD-10-CM | POA: Diagnosis present

## 2021-03-29 DIAGNOSIS — R7401 Elevation of levels of liver transaminase levels: Secondary | ICD-10-CM | POA: Diagnosis present

## 2021-03-29 DIAGNOSIS — L0390 Cellulitis, unspecified: Secondary | ICD-10-CM | POA: Diagnosis present

## 2021-03-29 DIAGNOSIS — D649 Anemia, unspecified: Secondary | ICD-10-CM

## 2021-03-29 LAB — CBC WITH DIFFERENTIAL/PLATELET
Abs Immature Granulocytes: 0.14 10*3/uL — ABNORMAL HIGH (ref 0.00–0.07)
Basophils Absolute: 0.1 10*3/uL (ref 0.0–0.1)
Basophils Relative: 1 %
Eosinophils Absolute: 0.1 10*3/uL (ref 0.0–0.5)
Eosinophils Relative: 1 %
HCT: 24.7 % — ABNORMAL LOW (ref 39.0–52.0)
Hemoglobin: 7.5 g/dL — ABNORMAL LOW (ref 13.0–17.0)
Immature Granulocytes: 1 %
Lymphocytes Relative: 12 %
Lymphs Abs: 2.1 10*3/uL (ref 0.7–4.0)
MCH: 28.2 pg (ref 26.0–34.0)
MCHC: 30.4 g/dL (ref 30.0–36.0)
MCV: 92.9 fL (ref 80.0–100.0)
Monocytes Absolute: 1 10*3/uL (ref 0.1–1.0)
Monocytes Relative: 5 %
Neutro Abs: 14.2 10*3/uL — ABNORMAL HIGH (ref 1.7–7.7)
Neutrophils Relative %: 80 %
Platelets: 837 10*3/uL — ABNORMAL HIGH (ref 150–400)
RBC: 2.66 MIL/uL — ABNORMAL LOW (ref 4.22–5.81)
RDW: 13.6 % (ref 11.5–15.5)
WBC: 17.6 10*3/uL — ABNORMAL HIGH (ref 4.0–10.5)
nRBC: 0 % (ref 0.0–0.2)

## 2021-03-29 LAB — BASIC METABOLIC PANEL
Anion gap: 7 (ref 5–15)
BUN: 11 mg/dL (ref 6–20)
CO2: 26 mmol/L (ref 22–32)
Calcium: 8.5 mg/dL — ABNORMAL LOW (ref 8.9–10.3)
Chloride: 103 mmol/L (ref 98–111)
Creatinine, Ser: 0.68 mg/dL (ref 0.61–1.24)
GFR, Estimated: >60 mL/min (ref >60)
Glucose, Bld: 93 mg/dL (ref 70–99)
Potassium: 4.1 mmol/L (ref 3.5–5.1)
Sodium: 136 mmol/L (ref 135–145)

## 2021-03-29 MED ORDER — OXYCODONE-ACETAMINOPHEN 5-325 MG PO TABS
1.0000 | ORAL_TABLET | Freq: Once | ORAL | Status: AC
  Administered 2021-03-29: 1 via ORAL
  Filled 2021-03-29: qty 1
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
## 2021-03-29 NOTE — ED Triage Notes (Signed)
Pt here for wound check. Pt has multiple wounds on his right and left abd, drainage noted on dressing. Pt denies fever or chills.

## 2021-03-30 DIAGNOSIS — L0390 Cellulitis, unspecified: Secondary | ICD-10-CM

## 2021-03-30 DIAGNOSIS — L03311 Cellulitis of abdominal wall: Secondary | ICD-10-CM

## 2021-03-30 LAB — CBC
HCT: 27 % — ABNORMAL LOW (ref 39.0–52.0)
Hemoglobin: 8 g/dL — ABNORMAL LOW (ref 13.0–17.0)
MCH: 28.1 pg (ref 26.0–34.0)
MCHC: 29.6 g/dL — ABNORMAL LOW (ref 30.0–36.0)
MCV: 94.7 fL (ref 80.0–100.0)
Platelets: 737 10*3/uL — ABNORMAL HIGH (ref 150–400)
RBC: 2.85 MIL/uL — ABNORMAL LOW (ref 4.22–5.81)
RDW: 13.4 % (ref 11.5–15.5)
WBC: 16.7 10*3/uL — ABNORMAL HIGH (ref 4.0–10.5)
nRBC: 0.1 % (ref 0.0–0.2)

## 2021-03-30 LAB — PROTIME-INR
INR: 1.2 (ref 0.8–1.2)
Prothrombin Time: 15.1 seconds (ref 11.4–15.2)

## 2021-03-30 LAB — HEPATIC FUNCTION PANEL
ALT: 45 U/L — ABNORMAL HIGH (ref 0–44)
AST: 45 U/L — ABNORMAL HIGH (ref 15–41)
Albumin: 2.5 g/dL — ABNORMAL LOW (ref 3.5–5.0)
Alkaline Phosphatase: 148 U/L — ABNORMAL HIGH (ref 38–126)
Bilirubin, Direct: 0.2 mg/dL (ref 0.0–0.2)
Indirect Bilirubin: 0.4 mg/dL (ref 0.3–0.9)
Total Bilirubin: 0.6 mg/dL (ref 0.3–1.2)
Total Protein: 7 g/dL (ref 6.5–8.1)

## 2021-03-30 LAB — LACTIC ACID, PLASMA
Lactic Acid, Venous: 1.8 mmol/L (ref 0.5–1.9)
Lactic Acid, Venous: 1.1 mmol/L (ref 0.5–1.9)

## 2021-03-30 LAB — SARS CORONAVIRUS 2 (TAT 6-24 HRS): SARS Coronavirus 2: NEGATIVE

## 2021-03-30 LAB — HEPARIN LEVEL (UNFRACTIONATED): Heparin Unfractionated: 0.21 IU/mL — ABNORMAL LOW (ref 0.30–0.70)

## 2021-03-30 LAB — CREATININE, SERUM
Creatinine, Ser: 0.65 mg/dL (ref 0.61–1.24)
GFR, Estimated: >60 mL/min (ref >60)

## 2021-03-30 MED ORDER — SODIUM CHLORIDE 0.9 % IV SOLN
3.0000 g | Freq: Four times a day (QID) | INTRAVENOUS | Status: DC
  Administered 2021-03-30 – 2021-04-07 (×34): 3 g via INTRAVENOUS
  Filled 2021-03-30: qty 8
  Filled 2021-03-30 (×2): qty 3
  Filled 2021-03-30 (×4): qty 8
  Filled 2021-03-30 (×5): qty 3
  Filled 2021-03-30 (×3): qty 8
  Filled 2021-03-30 (×2): qty 3
  Filled 2021-03-30: qty 8
  Filled 2021-03-30 (×3): qty 3
  Filled 2021-03-30: qty 8
  Filled 2021-03-30: qty 3
  Filled 2021-03-30: qty 8
  Filled 2021-03-30 (×5): qty 3
  Filled 2021-03-30: qty 8
  Filled 2021-03-30: qty 3
  Filled 2021-03-30: qty 8
  Filled 2021-03-30 (×5): qty 3
  Filled 2021-03-30: qty 8
  Filled 2021-03-30: qty 3

## 2021-03-30 MED ORDER — HEPARIN (PORCINE) 25000 UT/250ML-% IV SOLN
1050.0000 [IU]/h | INTRAVENOUS | Status: DC
  Administered 2021-03-30: 900 [IU]/h via INTRAVENOUS
  Filled 2021-03-30 (×2): qty 250

## 2021-03-30 MED ORDER — ACETAMINOPHEN 325 MG PO TABS: 650.0000 mg | ORAL_TABLET | Freq: Four times a day (QID) | ORAL | Status: DC | PRN

## 2021-03-30 MED ORDER — SODIUM CHLORIDE 0.9 % IV BOLUS
1000.0000 mL | Freq: Once | INTRAVENOUS | Status: AC
  Administered 2021-03-30: 1000 mL via INTRAVENOUS

## 2021-03-30 MED ORDER — BISACODYL 5 MG PO TBEC
5.0000 mg | DELAYED_RELEASE_TABLET | Freq: Every day | ORAL | Status: DC | PRN
  Filled 2021-03-30: qty 1

## 2021-03-30 MED ORDER — SENNOSIDES-DOCUSATE SODIUM 8.6-50 MG PO TABS: 1.0000 | ORAL_TABLET | Freq: Every evening | ORAL | Status: DC | PRN

## 2021-03-30 MED ORDER — ENOXAPARIN SODIUM 40 MG/0.4ML IJ SOSY
40.0000 mg | PREFILLED_SYRINGE | INTRAMUSCULAR | Status: DC
  Administered 2021-03-30: 40 mg via SUBCUTANEOUS
  Filled 2021-03-30: qty 0.4

## 2021-03-30 MED ORDER — OXYCODONE HCL 5 MG PO TABS
5.0000 mg | ORAL_TABLET | ORAL | Status: DC | PRN
  Administered 2021-03-30 – 2021-04-07 (×14): 5 mg via ORAL
  Filled 2021-03-30 (×14): qty 1

## 2021-03-30 MED ORDER — COLLAGENASE 250 UNIT/GM EX OINT
TOPICAL_OINTMENT | Freq: Every day | CUTANEOUS | Status: DC
  Administered 2021-03-30: 1 via TOPICAL
  Filled 2021-03-30 (×2): qty 30

## 2021-03-30 MED ORDER — ACETAMINOPHEN 650 MG RE SUPP: 650.0000 mg | Freq: Four times a day (QID) | RECTAL | Status: DC | PRN

## 2021-03-30 MED ORDER — CLINDAMYCIN PHOSPHATE 600 MG/50ML IV SOLN
600.0000 mg | Freq: Once | INTRAVENOUS | Status: AC
  Administered 2021-03-30: 600 mg via INTRAVENOUS
  Filled 2021-03-30: qty 50

## 2021-03-30 MED ORDER — CLINDAMYCIN PHOSPHATE 600 MG/50ML IV SOLN
600.0000 mg | Freq: Three times a day (TID) | INTRAVENOUS | Status: DC
  Filled 2021-03-30: qty 50

## 2021-03-30 NOTE — ED Notes (Signed)
Pt received lunch tray, family at bedside

## 2021-03-30 NOTE — Progress Notes (Signed)
HD#0 SUBJECTIVE:  Patient Summary: Bryce [name] is a 35 y.o. with no known past medical history, who presented with increased pain and drainage from his wounds and admitted for further evaluation management of wounds.   Overnight Events: admitted  Interim History: Patient resting in bed continues to have some pain around his abdominal wounds.  States that he has had some chills but denies any overt fevers.  Continues to endorse drainage from his wounds.  Otherwise denies any acute complaints  OBJECTIVE:  Vital Signs: Vitals:   03/30/21 0545 03/30/21 0600 03/30/21 0645 03/30/21 1130  BP: 110/60 (!) 98/55 (!) 97/55 96/60  Pulse: 72 72 68 84  Resp: _0 Temp:    98.7 F (37.1 C)  TempSrc:    Oral  SpO2: 100% 98% 99% 100%   Supplemental O2: Room Air SpO2: 100 %  There were no vitals filed for this visit.   Intake/Output Summary (Last 24 hours) at 03/30/2021 1133 Last data filed at 03/30/2021 0500 Gross per 24 hour  Intake 1050 ml  Output --  Net 1050 ml   Net IO Since Admission: 1,050 mL [03/30/21 1133]  Physical Exam: Physical Exam Constitutional:      Appearance: Normal appearance.  HENT:     Head: Normocephalic and atraumatic.  Eyes:     Extraocular Movements: Extraocular movements intact.  Cardiovascular:     Rate and Rhythm: Normal rate and regular rhythm.     Pulses: Normal pulses.     Heart sounds: Normal heart sounds.  Pulmonary:     Effort: Pulmonary effort is normal.     Breath sounds: Normal breath sounds.  Abdominal:     General: Bowel sounds are normal. There is no distension.     Palpations: Abdomen is soft.     Tenderness: There is no abdominal tenderness.  Musculoskeletal:        General: Normal range of motion.     Cervical back: Normal range of motion.     Right lower leg: No edema.     Left lower leg: No edema.  Skin:    General: Skin is warm and dry.     Findings: Wound (3x surgical abdominal wounds) present.      Comments: Rt flank -surgical wound with partially macerated margins and evidence of eschar around the wound with evidence of good healing wound base and serosanguineous drainage, surrounding dark tissue discoloration. Lf flank -surgical wound with clean margins and wound base, evidence of serosanguineous drainage and surrounding dark discoloration, Central abdomen - surgical wound with linear running closure without significant edema or surrounding erythema. (See media for images)  Neurological:     Mental Status: He is alert and oriented to person, place, and time. Mental status is at baseline.  Psychiatric:        Mood and Affect: Mood normal.    Patient Lines/Drains/Airways Status     Active Line/Drains/Airways     Name Placement date Placement time Site Days   Peripheral IV 03/17/21 20 G 1" Left;Anterior Forearm 03/17/21  1854  Forearm  13   Peripheral IV 03/30/21 20 G Left Antecubital 03/30/21  0230  Antecubital  less than 1   Incision (Closed) 03/16/21 Abdomen 03/16/21  1341  -- 14   Wound / Incision (Open or Dehisced) 03/14/21 Other (Comment) Hip Anterior;Right Serous blister (pocket) 03/14/21  1524  Hip  16   Wound / Incision (Open or Dehisced) 03/14/21 Non-pressure wound Hip Anterior;Left serous filled  blisters left hip/flank 03/14/21  0000  Hip  16   Wound / Incision (Open or Dehisced) 03/19/21 Non-pressure wound Thigh Anterior;Right Blister, induration 03/19/21  0800  Thigh  11            Pertinent Labs: CBC Latest Ref Rng & Units 03/30/2021 03/29/2021 03/23/2021  WBC 4.0 - 10.5 K/uL 16.7(H) 17.6(H) 21.9(H)  Hemoglobin 13.0 - 17.0 g/dL 8.0(L) 7.5(L) 7.2(L)  Hematocrit 39.0 - 52.0 % 27.0(L) 24.7(L) 21.9(L)  Platelets 150 - 400 K/uL 737(H) 837(H) 477(H)    CMP Latest Ref Rng & Units 03/30/2021 03/29/2021 03/23/2021  Glucose 70 - 99 mg/dL - 93 99  BUN 6 - 20 mg/dL - 11 18  Creatinine 0.61 - 1.24 mg/dL 0.65 0.68 0.76  Sodium 135 - 145 mmol/L - 136 129(L)  Potassium 3.5 - 5.1  mmol/L - 4.1 4.0  Chloride 98 - 111 mmol/L - 103 98  CO2 22 - 32 mmol/L - 26 22  Calcium 8.9 - 10.3 mg/dL - 8.5(L) 8.0(L)  Total Protein 6.5 - 8.1 g/dL 7.0 - 5.9(L)  Total Bilirubin 0.3 - 1.2 mg/dL 0.6 - 0.6  Alkaline Phos 38 - 126 U/L 148(H) - 375(H)  AST 15 - 41 U/L 45(H) - 47(H)  ALT 0 - 44 U/L 45(H) - 37    No results for input(s): GLUCAP in the last 72 hours.   Pertinent Imaging: No results found.  ASSESSMENT/PLAN:  Assessment: Active Problems:   Abdominal wall cellulitis   Wound cellulitis   Plan: #GAS soft tissue infection: #Pyomyositis: Patient continues to have pain and serosanguineous drainage from his wounds particularly his bilateral flanks.  His right flank wound does have some evidence of eschar which will likely need some sharp debridement.  Otherwise, no evidence of systemic infection other than a leukocytosis that has improved from his previous CBC.  Patient denies any significant pain in his bilateral thighs although he previously had a small abscess of his left vastus medialis concerning for pyomyositis.  Unlikely to need additional imaging at this time as his overall clinical status seems to be improving. -Continue Unasyn, DC clindamycin -Blood cultures 2x pending -We will consult surgery for possible debridement -Appreciate wound care's assistance with this patient  Normocytic Anemia -Hemoglobin stable at this time -We will need iron supplementation in the near future -We will transfuse if hemoglobin is less than 7  Mildly elevated AST/ALT Elevated alkaline phosphatase, biliary sludge Alk phos and transaminases are trending downward since his last admission.  We will continue to monitor throughout this hospitalization.    Best Practice: Diet: Regular diet IVF: Fluids: none, Rate: None VTE: enoxaparin (LOVENOX) injection 40 mg Start: 03/30/21 1000 Code: Full AB: Unasyn and Clindamycin Therapy Recs: None, DME: none Family Contact: none DISPO:  Anticipated discharge in 0-1 days to Home pending IV antibiotics.  Signature: Lawerance Cruel, D.O.  Internal Medicine Resident, PGY-3 [facility]  Pager: [phone] 11:33 AM, 03/30/2021   Please contact the on call pager after 5 pm and on weekends at [phone].
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
## 2021-03-30 NOTE — ED Notes (Signed)
Attempted to call report, was advised that RN is on lunch break. Left number for call back.
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
## 2021-03-30 NOTE — Consult Note (Signed)
WOC Nurse Consult Note: Reason for Consult:Flank wounds. WOC Nursing was simultaneously consulted with CCS for these wounds, [facility] (Dr. B.Janee Morn) saw today and provided guidance for wound care. [facility]CS is going to follow. Wound type:Infectious, trauma Pressure Injury POA: N/A   WOC nursing team will not follow, but will remain available to this patient, the nursing and medical teams.  Please re-consult if needed. Thanks, Ladona Mow, MSN, RN, GNP, [place]  Pager# [phone]

## 2021-03-30 NOTE — H&P (Addendum)
Date: 03/30/2021               Patient Name:  Bryce [name] MRN: [id number]  DOB: Apr 06, 1986 Age / Sex: 35 y.o., male   PCP: Pcp, No         Medical Service: Internal Medicine Teaching Service         Attending Physician: Dr. Quintella Reichert, MD    First Contact: Scarlett Presto, MD Pager: [facility] [phone]  Second Contact: Marianna Payment, DO Pager: [facility] [phone]       After Hours (After 5p/  First Contact Pager: [phone]  weekends / holidays): Second Contact Pager: [phone]   SUBJECTIVE   Chief Complaint: Wound infection   History of Present Illness:  Bryce [name] is a 35 y.o. M with no known past medical hx presenting to [facility] ED complaining of wound infection. He was recently admitted to the hospital on 03/13/21 for septic shock secondary to group a strep bacteremia, and had surgical debridement on 03/16/21 for bite wound. Pt was discharged on 03/23/21 on oral antibiotics, with f/u visits with ID clinic and wound care center. Pt complaint with medications after d/c and has been changing dressing once daily. He presents today because he feels like his wounds are infected, and states that for 2-3 days there has been increased yellow drainage from the wound. He has increased pain at the wound site and his body feels hot and also complains of chills. Pt denies any associated nausea, vomiting, CP, new SOB, and palpitations.   Meds:  No outpatient medications have been marked as taking for the 03/29/21 encounter [facility] Encounter).    Past Medical History:  Diagnosis Date   Dyspnea    Known health problems: none 03/14/2021    Past Surgical History:  Procedure Laterality Date   I & D EXTREMITY Bilateral 03/16/2021   Procedure: IRRIGATION AND DEBRIDEMENT FLANKS POSSIBLE ABDOMINAL WALL;  Surgeon: Rolm Bookbinder, MD;  Location: [facility];  Service: General;  Laterality: Bilateral;   NO PAST SURGERIES      Social:  Lives With: friend in [place]. Occupation: roofing  Support:  Family lives in the area Level of Function: independent PCP: none Substances: No drugs, no tobacco use, alcohol use 3-4 beers/day with last use 15 days ago.   Family History:  No family hx on file   Allergies: Allergies as of 03/29/2021   (No Known Allergies)    Review of Systems: A complete ROS was negative except as per HPI.   OBJECTIVE:   Physical Exam: Blood pressure 106/61, pulse 69, temperature 99.1 F (37.3 C), temperature source Oral, resp. rate 12, SpO2 99 %.  Constitutional: well-appearing, in no acute distress HENT: normocephalic atraumatic, mucous membranes moist Eyes: conjunctiva non-erythematous, extraocular movements intact Neck: supple Cardiovascular: regular rate and rhythm, no m/r/g Pulmonary/Chest: normal work of breathing on room air, lungs clear to auscultation bilaterally Abdominal: Per chart review photos, infraumbilical wound healing well. Right and left flank wounds appear to be non-improving, with areas of necrotic tissue on right flank wound.  MSK: normal bulk and tone Neurological: alert & oriented x 3, 5/5 strength in bilateral upper and lower extremities Skin: warm and dry Psych: normal behavior, normal affect  EKG: sinus tachycardia    ASSESSMENT & PLAN:    Assessment & Plan by Problem: Active Problems:   * No active hospital problems. *   Bryce [name] is a 35 y.o. with no known past medical hx who presents to [facility] ED due to  concerns for wound infection s/p septic shock secondary to GAS bacteremia and surgical debridement on 03/16/21 for bite wound.   GAS soft tissue infection  Pt presents due to concerns for flank bite wound infection. He complains of pain around wound sites, fever, chills, and increased yellow drainage from the wound x 2-3 days. Per chart review photos, infraumbilical wound healing well. Right and left flank wounds appear to be non-improving, with areas of necrotic tissue on right flank wound. WBC improving 17.6  -> 16.7, platelet count improving 837 -> 737, Cr 0.68, lactic acid 1.8.  -blood culture x 2 pending  -Started Clindamycin and unasyn  -consulted wound care   Pyomyositis  Small abscess in left vastus medialis noted on MRI during previous admission. No surgical intervention recommended at that time. Pt had improvement with IV antibiotic and was transitioned to augmentin bid at d/c. Pt does not complain of any symptoms related to this.  -Started clindamycin and unasyn -F/u with ID clinic on 04/03/21   Normocytic Anemia  Hg 8.0. Previous labs indicate iron deficiency anemia (iron 16, TIBC 248).  -Will hold off on iron supplementation in state of acute infection -Will transfuse if Hg < 7   Elevated alkaline phosphatase, biliary sludge Patient noted to have elevated alkaline phosphatase to 300-400 during prior admission. Further evaluation with RUQ US demonstrated new biliary sludge compared to CT Abdomen/Pelvis on admission. Suspected to be secondary to  acute illness and remained asymptomatic. F/u CMP was recommended and if development of symptoms, referral to surgery. -Alk phos improved from 375 (03/23/21) to 148 today -continue to monitor   Mildly elevated AST/ALT -Mildly elevated since prior admission  -Would have outpatient work up for viral hepatitis or other causes for mild transaminitis   Signed: Lajean Manes, MD Internal Medicine Resident, PGY-1 Pager: [phone] 03/30/2021, 1:53 AM

## 2021-03-30 NOTE — ED Provider Notes (Signed)
[facility] EMERGENCY DEPARTMENT Provider Note   CSN: [id number] Arrival date & time: 03/29/21  1433     History Chief Complaint  Patient presents with   Wound Check    Keison Glendinning is a 35 y.o. male.  The history is provided by the patient and medical records. A language interpreter was used.  Wound Check Eliott Amparan is a 35 y.o. male who presents to the Emergency Department complaining of wound infection. He was recently admitted to the hospital for septic shock secondary to group a strep infection. He had surgical debridement on July 16. He was discharged home on July 23 on oral antibiotics. He has been compliant with his medications since hospital discharge. He presents to the hospital today due to feeling like his wounds are infected. He states that for 2 to 3 days there has been increased yellow drainage from the wound. He is changing the dressings once daily. He has increased pain at the wound site and his body feels hot. He has chills as well. He has not checked his temperature at home. Denies any associated nausea, vomiting, chest pain, difficulty breathing. He did have one loose stool today.     Past Medical History:  Diagnosis Date   Dyspnea    Known health problems: none 03/14/2021    Patient Active Problem List   Diagnosis Date Noted   Volume overload 03/16/2021   Hypoalbuminemia 03/16/2021   Acute respiratory failure (HCC) 03/15/2021   Group A Streptococcal Bacteremia 03/14/2021   Sepsis (HCC) 03/13/2021   Abdominal wall cellulitis 03/13/2021   Acute renal failure (HCC) 03/13/2021   Lactic acidosis 03/13/2021    Past Surgical History:  Procedure Laterality Date   I & D EXTREMITY Bilateral 03/16/2021   Procedure: IRRIGATION AND DEBRIDEMENT FLANKS POSSIBLE ABDOMINAL WALL;  Surgeon: Emelia Loron, MD;  Location: [facility] OR;  Service: General;  Laterality: Bilateral;   NO PAST SURGERIES         Family History  Family  history unknown: Yes    Social History   Tobacco Use   Smoking status: Former    Types: Cigarettes    Passive exposure: Never   Smokeless tobacco: Never  Substance Use Topics   Alcohol use: Yes    Comment: 12-14 per week   Drug use: Not Currently    Types: Marijuana, Cocaine    Home Medications Prior to Admission medications   Medication Sig Start Date End Date Taking? Authorizing Provider  amoxicillin-clavulanate (AUGMENTIN) 875-125 MG tablet Take 1 tablet by mouth every 12 (twelve) hours for 14 days. 03/22/21 04/05/21  Eliezer Bottom, MD  feeding supplement (ENSURE ENLIVE / ENSURE PLUS) LIQD Take 237 mLs by mouth at bedtime. 03/22/21   Eliezer Bottom, MD  Multiple Vitamins-Minerals (CERTAVITE/ANTIOXIDANTS) TABS Take 1 tablet by mouth daily. 03/23/21 04/22/21  Eliezer Bottom, MD  OVER THE COUNTER MEDICATION Unknown OTC for Pain and Inflammation    [provider]    Allergies    Patient has no known allergies.  Review of Systems   Review of Systems  All other systems reviewed and are negative.  Physical Exam Updated Vital Signs BP 106/61 (BP Location: Right Arm)   Pulse 69   Temp 99.1 F (37.3 C) (Oral)   Resp 12   SpO2 99%   Physical Exam Vitals and nursing note reviewed.  Constitutional:      Appearance: He is well-developed.  HENT:     Head: Normocephalic and atraumatic.  Cardiovascular:  Rate and Rhythm: Normal rate and regular rhythm.     Heart sounds: No murmur heard. Pulmonary:     Effort: Pulmonary effort is normal. No respiratory distress.     Breath sounds: Normal breath sounds.  Abdominal:     Palpations: Abdomen is soft.     Tenderness: There is no guarding or rebound.     Comments: Mild generalized abdominal tenderness multiple wounds to the flanks. Right flank has surrounding necrotic tissue with small amount of purulent discharge in the center. Midline lower abdominal incision clean, dry, intact.  Musculoskeletal:        General: No  tenderness.  Skin:    General: Skin is warm and dry.  Neurological:     Mental Status: He is alert and oriented to person, place, and time.  Psychiatric:        Behavior: Behavior normal.         ED Results / Procedures / Treatments   Labs (all labs ordered are listed, but only abnormal results are displayed) Labs Reviewed  CBC WITH DIFFERENTIAL/PLATELET - Abnormal; Notable for the following components:      Result Value   WBC 17.6 (*)    RBC 2.66 (*)    Hemoglobin 7.5 (*)    HCT 24.7 (*)    Platelets 837 (*)    Neutro Abs 14.2 (*)    Abs Immature Granulocytes 0.14 (*)    All other components within normal limits  BASIC METABOLIC PANEL - Abnormal; Notable for the following components:   Calcium 8.5 (*)    All other components within normal limits    EKG None  Radiology No results found.  Procedures Procedures   Medications Ordered in ED Medications  oxyCODONE-acetaminophen (PERCOCET/ROXICET) 5-325 MG per tablet 1 tablet (1 tablet Oral Given 03/29/21 2049)    ED Course  I have reviewed the triage vital signs and the nursing notes.  Pertinent labs & imaging results that were available during my care of the patient were reviewed by me and considered in my medical decision making (see chart for details).    MDM Rules/Calculators/A&P                          patient with recent admission for group A streptococcus bacteremia as well as multiple wounds at the required surgical debridement. He has systemic symptoms of chills, body aches and increased pain from the wounds. He is compliant with his home antibiotics. CBC with stable anemia. He does have a significant leukocytosis, this is slightly decreased compared to recent hospital admission but he also has significant elevation is platelets, more so than at time of hospital discharge. BMP with normal renal function. Concern for worsening infection, will start antibiotics, blood cultures and admit for  observation.  Final Clinical Impression(s) / ED Diagnoses Final diagnoses:  None    Rx / DC Orders ED Discharge Orders     None        Tilden Fossa, MD 03/30/21 [phone]

## 2021-03-30 NOTE — ED Notes (Addendum)
Nallelly - Spanish interpretor used for explanation of Heparin admin.

## 2021-03-30 NOTE — Progress Notes (Signed)
ANTICOAGULATION CONSULT NOTE - Initial Consult  Pharmacy Consult for Heparin Indication: atrial fibrillation  No Known Allergies  Patient Measurements:   Heparin Dosing Weight: 59.3 kg  Vital Signs: Temp: 98.7 F (37.1 C) (07/30 1130) Temp Source: Oral (07/30 1130) BP: 96/60 (07/30 1130) Pulse Rate: 84 (07/30 1130)  Labs: Recent Labs    03/29/21 1558 03/30/21 0229  HGB 7.5* 8.0*  HCT 24.7* 27.0*  PLT 837* 737*  CREATININE 0.68 0.65    CrCl cannot be calculated (Unknown ideal weight.).   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  Heparin per pharmacy placed for AF. Patient is not on anticoagulation prior to arrival. Hgb 8 (at baseline); plt 737.  Plan for surgery consult for debridement per internal medicine note.  Goal of Therapy:  Heparin level 0.3-0.7 units/ml Monitor platelets by anticoagulation protocol: Yes   Plan:  No initial heparin bolus Start heparin infusion at 900 units/hr Check anti-Xa level in 6-8 hours and daily while on heparin Continue to monitor H&H and platelets F/u surgery recommendations  Delmar Landau, PharmD, BCPS 03/30/2021 11:44 AM ED Clinical Pharmacist -  [phone]

## 2021-03-31 DIAGNOSIS — L0390 Cellulitis, unspecified: Secondary | ICD-10-CM | POA: Diagnosis present

## 2021-03-31 LAB — CBC
HCT: 30.1 % — ABNORMAL LOW (ref 39.0–52.0)
Hemoglobin: 10.1 g/dL — ABNORMAL LOW (ref 13.0–17.0)
MCH: 28.9 pg (ref 26.0–34.0)
MCHC: 33.6 g/dL (ref 30.0–36.0)
MCV: 86 fL (ref 80.0–100.0)
Platelets: 615 10*3/uL — ABNORMAL HIGH (ref 150–400)
RBC: 3.5 MIL/uL — ABNORMAL LOW (ref 4.22–5.81)
RDW: 13.3 % (ref 11.5–15.5)
WBC: 9.6 10*3/uL (ref 4.0–10.5)
nRBC: 0 % (ref 0.0–0.2)

## 2021-03-31 LAB — COMPREHENSIVE METABOLIC PANEL
ALT: 54 U/L — ABNORMAL HIGH (ref 0–44)
AST: 57 U/L — ABNORMAL HIGH (ref 15–41)
Albumin: 1.8 g/dL — ABNORMAL LOW (ref 3.5–5.0)
Alkaline Phosphatase: 119 U/L (ref 38–126)
Anion gap: 7 (ref 5–15)
BUN: <5 mg/dL — ABNORMAL LOW (ref 6–20)
CO2: 27 mmol/L (ref 22–32)
Calcium: 8.1 mg/dL — ABNORMAL LOW (ref 8.9–10.3)
Chloride: 104 mmol/L (ref 98–111)
Creatinine, Ser: 0.76 mg/dL (ref 0.61–1.24)
GFR, Estimated: >60 mL/min (ref >60)
Glucose, Bld: 92 mg/dL (ref 70–99)
Potassium: 3.7 mmol/L (ref 3.5–5.1)
Sodium: 138 mmol/L (ref 135–145)
Total Bilirubin: 0.4 mg/dL (ref 0.3–1.2)
Total Protein: 5.6 g/dL — ABNORMAL LOW (ref 6.5–8.1)

## 2021-03-31 LAB — CBC WITH DIFFERENTIAL/PLATELET
Abs Immature Granulocytes: 0.1 10*3/uL — ABNORMAL HIGH (ref 0.00–0.07)
Basophils Absolute: 0.1 10*3/uL (ref 0.0–0.1)
Basophils Relative: 1 %
Eosinophils Absolute: 0.3 10*3/uL (ref 0.0–0.5)
Eosinophils Relative: 3 %
HCT: 21.7 % — ABNORMAL LOW (ref 39.0–52.0)
Hemoglobin: 6.8 g/dL — CL (ref 13.0–17.0)
Immature Granulocytes: 1 %
Lymphocytes Relative: 19 %
Lymphs Abs: 1.9 10*3/uL (ref 0.7–4.0)
MCH: 28.2 pg (ref 26.0–34.0)
MCHC: 31.3 g/dL (ref 30.0–36.0)
MCV: 90 fL (ref 80.0–100.0)
Monocytes Absolute: 0.8 10*3/uL (ref 0.1–1.0)
Monocytes Relative: 8 %
Neutro Abs: 7.1 10*3/uL (ref 1.7–7.7)
Neutrophils Relative %: 68 %
Platelets: 585 10*3/uL — ABNORMAL HIGH (ref 150–400)
RBC: 2.41 MIL/uL — ABNORMAL LOW (ref 4.22–5.81)
RDW: 13.2 % (ref 11.5–15.5)
WBC: 10.3 10*3/uL (ref 4.0–10.5)
nRBC: 0 % (ref 0.0–0.2)

## 2021-03-31 LAB — HEPARIN LEVEL (UNFRACTIONATED): Heparin Unfractionated: 0.13 IU/mL — ABNORMAL LOW (ref 0.30–0.70)

## 2021-03-31 LAB — PREPARE RBC (CROSSMATCH)

## 2021-03-31 MED ORDER — SODIUM CHLORIDE 0.9% IV SOLUTION: Freq: Once | INTRAVENOUS | Status: AC

## 2021-03-31 NOTE — Progress Notes (Signed)
   Subjective:  Patient with no acute overnight events.  However, this morning patient's CBC showing hemoglobin 6.8.  Transfused 1 unit PRBCs.  Spoke with with video interpreter.  He states that he feels so-so.  Denies any pain at this time.  Discussed with patient about treatment plan.  He states that he is agreeable to undergo surgery for debridement because he does not want to go home and then have to come back again for this.  Objective:  Vital signs in last 24 hours: Vitals:   03/30/21 2022 03/31/21 0014 03/31/21 0408 03/31/21 0934  BP: (!) 103/59 (!) 97/54 (!) 94/51 (!) 100/53  Pulse: 78 78 69 64  Resp: 17 16 20 14   Temp: 99 F (37.2 C) 98.2 F (36.8 C) 97.8 F (36.6 C) 98.4 F (36.9 C)  TempSrc: Oral Oral Oral Oral  SpO2: 100% 100% 99% 100%  Weight:      Height:       Physical exam: General: Well-nourished young male, lying in bed, NAD. CV: Normal rate and regular rhythm, no murmurs rubs or gallops. Pulm: Clear to auscultation bilaterally, no adventitious sounds noted.  Normal pulmonary effort. Abdominal: Soft, nontender, nondistended.  Flank wounds with clean dressings packed.  Central abdominal wound clean and closed without erythema. Skin: Warm and dry Neuro: Alert and oriented x4.  Assessment/Plan:  Active Problems:   Abdominal wall cellulitis   Wound cellulitis   Cellulitis  Group A strep soft tissue infection Pyomyositis Patient reports some improvement in pain today.  Flank wounds appear unchanged from yesterday.  Discussed with patient about pursuing surgical debridement for better source control of wound infection, patient is agreeable to this.  Discussed with surgery about patient wanting to pursue surgery, Dr. advised to make patient n.p.o. at midnight for potential surgical debridement tomorrow.  We will continue with antibiotic coverage as well. -Continue Unasyn -Blood cultures pending -Continue Santyl per surgery -Greatly appreciate surgery  assistance and wound care assistance  Normocytic anemia Patient had a drop in hemoglobin down to 6.8 today.  No signs of overt bleeding and he denies any hematochezia, melena, or hematemesis.  He is still hemodynamically stable, but will transfuse 1 unit given hemoglobin less than 7.  Patient does appear to have iron deficiency with iron 16, sat 6.  Ferritin likely elevated in the setting of acute infection. -Transfuse 1 unit PRBC -Will likely need iron supplementation in the near future -Trend CBC -Discontinued heparin  Prior to Admission Living Arrangement: Home Anticipated Discharge Location: Home Barriers to Discharge: continued medical management Dispo: Anticipated discharge in approximately 2-3 day(s).   Janee Morn, MD 03/31/2021, 10:27 AM Pager: [phone] After 5pm on weekdays and 1pm on weekends: On Call pager [phone]

## 2021-03-31 NOTE — Consult Note (Signed)
WOC Nurse Consult Note: Reconsulted by [facility] (Dr. Cliffton Asters) for consideration of enzymatic debriding agent to flank wound.  Dr. Leonard [name]. Janee Morn ordered Moore on Saturday. I will provide Nursing with orders in addition to those provided with pharmaceutical/prescriptive. I communicated with Dr. Cliffton Asters via Secure Chat that downstream, after collagenase has a chance to work a bit, patient may benefit from hydrotherapy as an add on to enzymatic debridement, either here in house via PT/Hydro services or using a hand-held shower device at home.  WOC nursing team will not follow, but will remain available to this patient, the nursing and medical teams.  Please re-consult if needed. Thanks, Ladona Mow, MSN, RN, GNP, [facility] [place]  Pager# [phone]

## 2021-03-31 NOTE — Progress Notes (Addendum)
Interpreter used to relay inform regarding blood transfusion to patient. Interpreter number [id number]. Blood transfusion commenced, patient is tolerating well. No adverse reaction noted.   15:35 pm Blood transfusion completed at this time. Patient is stable.

## 2021-03-31 NOTE — Progress Notes (Signed)
  Subjective No acute events. Soreness about wounds. No other complaints at present  Objective: Vital signs in last 24 hours: Temp:  [97.8 F (36.6 C)-99.5 F (37.5 C)] 97.8 F (36.6 C) (07/31 0408) Pulse Rate:  [69-84] 69 (07/31 0408) Resp:  [16-20] 20 (07/31 0408) BP: (94-103)/(51-60) 94/51 (07/31 0408) SpO2:  [99 %-100 %] 99 % (07/31 0408) Weight:  [59.3 kg] 59.3 kg (07/30 1239) Last BM Date: 03/29/21  Intake/Output from previous day: 07/30 0701 - 07/31 0700 In: 1181.5 [P.O.:740; I.V.:141.5; IV Piggyback:300] Out: 1600 [Urine:1600] Intake/Output this shift: No intake/output data recorded.  Gen: NAD, comfortable CV: RRR Pulm: Normal work of breathing Abd: Soft, NT/ND. Abdominal wound clean and closed without erythema. Flank wound with clean dressings packed. No significant blanching erythema Ext: SCDs in place  Lab Results: CBC  Recent Labs    03/30/21 0229 03/31/21 0646  WBC 16.7* 10.3  HGB 8.0* 6.8*  HCT 27.0* 21.7*  PLT 737* 585*   BMET Recent Labs    03/29/21 1558 03/30/21 0229 03/31/21 0646  NA 136  --  138  K 4.1  --  3.7  CL 103  --  104  CO2 26  --  27  GLUCOSE 93  --  92  BUN 11  --  <5*  CREATININE 0.68 0.65 0.76  CALCIUM 8.5*  --  8.1*   PT/INR Recent Labs    03/30/21 1300  LABPROT 15.1  INR 1.2   ABG No results for input(s): PHART, HCO3 in the last 72 hours.  Invalid input(s): PCO2, PO2  Studies/Results:  Anti-infectives: Anti-infectives (From admission, onward)    Start     Dose/Rate Route Frequency Ordered Stop   03/30/21 1400  clindamycin (CLEOCIN) IVPB 600 mg  Status:  Discontinued        600 mg 100 mL/hr over 30 Minutes Intravenous Every 8 hours 03/30/21 0407 03/30/21 1133   03/30/21 0430  Ampicillin-Sulbactam (UNASYN) 3 g in sodium chloride 0.9 % 100 mL IVPB        3 g 200 mL/hr over 30 Minutes Intravenous Every 6 hours 03/30/21 0407     03/30/21 0130  clindamycin (CLEOCIN) IVPB 600 mg        600 mg 100 mL/hr over 30  Minutes Intravenous  Once 03/30/21 0115 03/30/21 0405        Assessment/Plan: Patient Active Problem List   Diagnosis Date Noted   Wound cellulitis 03/30/2021   Volume overload 03/16/2021   Hypoalbuminemia 03/16/2021   Acute respiratory failure (HCC) 03/15/2021   Group A Streptococcal Bacteremia 03/14/2021   Sepsis (HCC) 03/13/2021   Abdominal wall cellulitis 03/13/2021   Acute renal failure (HCC) 03/13/2021   Lactic acidosis 03/13/2021   S/P debridement B flank and abdominal wounds associated with group A strep infection by Dr. Dwain Sarna 7/16  -Abdominal wound remains clean and closed -Flank wound with eschar but no significant blanching erythema at this point. He does not want surgical debridement at this time. Santyl may help slough? WOCN consult placed for further considerations; appreciate assistance in his care   LOS: 0 days   Marin Olp, MD [facility] Use [email or web address] to contact on call provider

## 2021-03-31 NOTE — Progress Notes (Signed)
Critical lab for HGB 6.8. MD notified.

## 2021-03-31 NOTE — Progress Notes (Signed)
  Date: 03/31/2021  Patient name: Bryce [name]  Medical record number: [id number]  Date of birth: 02/12/1986   This patient's plan of care was discussed with the house staff. Please see Dr. Everardo Pacific note for complete details. I concur with his findings.   Inez Catalina, MD 03/31/2021, 6:16 PM

## 2021-04-01 LAB — CBC WITH DIFFERENTIAL/PLATELET
Abs Immature Granulocytes: 0.07 10*3/uL (ref 0.00–0.07)
Basophils Absolute: 0.1 10*3/uL (ref 0.0–0.1)
Basophils Relative: 1 %
Eosinophils Absolute: 0.3 10*3/uL (ref 0.0–0.5)
Eosinophils Relative: 3 %
HCT: 28.6 % — ABNORMAL LOW (ref 39.0–52.0)
Hemoglobin: 9.1 g/dL — ABNORMAL LOW (ref 13.0–17.0)
Immature Granulocytes: 1 %
Lymphocytes Relative: 20 %
Lymphs Abs: 2 10*3/uL (ref 0.7–4.0)
MCH: 27.5 pg (ref 26.0–34.0)
MCHC: 31.8 g/dL (ref 30.0–36.0)
MCV: 86.4 fL (ref 80.0–100.0)
Monocytes Absolute: 0.9 10*3/uL (ref 0.1–1.0)
Monocytes Relative: 9 %
Neutro Abs: 6.4 10*3/uL (ref 1.7–7.7)
Neutrophils Relative %: 66 %
Platelets: 578 10*3/uL — ABNORMAL HIGH (ref 150–400)
RBC: 3.31 MIL/uL — ABNORMAL LOW (ref 4.22–5.81)
RDW: 13.2 % (ref 11.5–15.5)
WBC: 9.7 10*3/uL (ref 4.0–10.5)
nRBC: 0 % (ref 0.0–0.2)

## 2021-04-01 LAB — COMPREHENSIVE METABOLIC PANEL
ALT: 75 U/L — ABNORMAL HIGH (ref 0–44)
AST: 90 U/L — ABNORMAL HIGH (ref 15–41)
Albumin: 2 g/dL — ABNORMAL LOW (ref 3.5–5.0)
Alkaline Phosphatase: 129 U/L — ABNORMAL HIGH (ref 38–126)
Anion gap: 8 (ref 5–15)
BUN: 9 mg/dL (ref 6–20)
CO2: 27 mmol/L (ref 22–32)
Calcium: 8.3 mg/dL — ABNORMAL LOW (ref 8.9–10.3)
Chloride: 103 mmol/L (ref 98–111)
Creatinine, Ser: 0.81 mg/dL (ref 0.61–1.24)
GFR, Estimated: >60 mL/min (ref >60)
Glucose, Bld: 107 mg/dL — ABNORMAL HIGH (ref 70–99)
Potassium: 4.2 mmol/L (ref 3.5–5.1)
Sodium: 138 mmol/L (ref 135–145)
Total Bilirubin: 0.4 mg/dL (ref 0.3–1.2)
Total Protein: 6.1 g/dL — ABNORMAL LOW (ref 6.5–8.1)

## 2021-04-01 LAB — TYPE AND SCREEN
ABO/RH(D): O POS
Antibody Screen: NEGATIVE
Unit division: 0

## 2021-04-01 LAB — BPAM RBC
Blood Product Expiration Date: 202208312359
ISSUE DATE / TIME: 202207311141
Unit Type and Rh: 5100

## 2021-04-01 LAB — SURGICAL PCR SCREEN
MRSA, PCR: NEGATIVE
Staphylococcus aureus: NEGATIVE

## 2021-04-01 MED ORDER — MUPIROCIN 2 % EX OINT: 1.0000 "application " | TOPICAL_OINTMENT | Freq: Two times a day (BID) | CUTANEOUS | Status: DC

## 2021-04-01 MED ORDER — HYDROMORPHONE HCL 1 MG/ML IJ SOLN
1.0000 mg | INTRAMUSCULAR | Status: DC | PRN
  Administered 2021-04-01 – 2021-04-07 (×12): 1 mg via INTRAVENOUS
  Filled 2021-04-01 (×13): qty 1

## 2021-04-01 NOTE — Progress Notes (Signed)
   Subjective: Bryce [name] was seen and evaluated at bedside.  Spanish interpreter was used, [employer] ID [id number].  Bryce [name] states that he is feeling much better. No overnight events. His pain is moderately relieved with pain medication.  He denies any fever or chills.  He states that he is comfortable right now.  He is currently NPO currently awaiting surgical debridement, likely performed tomorrow.  Bryce [name] acknowledges and understands.  Objective:  Vital signs in last 24 hours: Vitals:   03/31/21 2030 04/01/21 0100 04/01/21 0511 04/01/21 1310  BP: 108/68 109/69 105/68 (!) 108/57  Pulse: 73 67 66 (!) 57  Resp: 16 15 16 16   Temp: 98.8 F (37.1 C) 98.1 F (36.7 C) 97.8 F (36.6 C) 98 F (36.7 C)  TempSrc: Oral Oral Oral Oral  SpO2: 100% 99% 99% 99%  Weight:      Height:       Physical Exam Constitutional:      General: He is awake.     Appearance: Normal appearance. He is not ill-appearing.  HENT:     Head: Normocephalic and atraumatic.  Cardiovascular:     Rate and Rhythm: Normal rate and regular rhythm.     Heart sounds: Normal heart sounds, S1 normal and S2 normal.  Pulmonary:     Effort: Pulmonary effort is normal.     Breath sounds: Normal breath sounds and air entry.  Abdominal:     General: Abdomen is flat.     Tenderness: There is abdominal tenderness (on palpation).     Comments: Incision on hypogastric region is clean and dry. Some serosanguineous fluid drainage noted on bandage. No surrounding erythema. No sign of infection Bilateral flanks- Left side-  wound is packed, clean dressing with surrounding scattered eschar formation. No erythema noted. Right side- wound is packed, clean dressing with surrounding scattered eschar formation. No erythema noted  Skin:    General: Skin is warm and dry.  Neurological:     General: No focal deficit present.     Mental Status: He is alert.  Psychiatric:        Attention and Perception: Attention normal.        Mood  and Affect: Mood normal.        Behavior: Behavior is cooperative.     Assessment/Plan:  Active Problems:   Abdominal wall cellulitis   Wound cellulitis   Cellulitis  Group A strep soft tissue infection Pyomyositis Patient reports some improvement in pain today.  Flank wounds appear unchanged from yesterday.   Currently NPO awaiting surgical debridement tomorrow. Blood culture- pending; per two days ago preliminary results show no growth.  -Continue Unasyn -Blood cultures pending -Continue Santyl per surgery -Greatly appreciate surgery assistance and wound care assistance  Normocytic anemia Patient was transfused 1 unit PRBC one day ago. He is currently hemodynamically stable. Current hemoglobin 9.1. Patient does appear to have iron deficiency with iron 16, sat 6.  Ferritin likely elevated in the setting of acute infection. -Will likely need iron supplementation in the near future -Trend CBC -Discontinued heparin    Prior to Admission Living Arrangement: Anticipated Discharge Location: Barriers to Discharge: Dispo: Anticipated discharge in approximately 2-3 day(s).   , MD 04/01/2021, 4:30 PM Pager: [phone] After 5pm on weekdays and 1pm on weekends: On Call pager [phone]

## 2021-04-01 NOTE — Progress Notes (Signed)
PT Hydrotherapy Cancellation Note  Patient Details Name: Bryce [name] MRN: [id number] DOB: [birth year]   Cancelled Treatment:    Reason Eval/Treat Not Completed: Discussed pt case with RN who reports pt is going down for surgical debridement today and that hydrotherapy is cancelled. If plan changes and MD wishes for hydro to start today, please page me at [phone]. Thank you.   Marylynn Pearson 04/01/2021, 10:03 AM  Conni Slipper, PT, DPT Acute Rehabilitation Services Pager: [phone] Office: [phone]

## 2021-04-01 NOTE — Progress Notes (Signed)
Pt told lab tech that he did not feel well. RN used the interpreter and pt stated that he was still having abdominal pain at 8/10. It is too soon for Oxy, and pt does not want Tylenol. RN messaged MD on call to see if pt can have something else. Pt is also NPO at this time. Will continue to monitor pt and awaiting response from on call MD.   Bryce [name]

## 2021-04-01 NOTE — Progress Notes (Signed)
Wound reviewed with MD after hydrotherapy. Eschar has been debrided. We do not recommended any surgical debridement at this time. Continue hydrotherapy and santyl WTD BID. We will be available as needed moving forward. Please call us back with any questions or concerns.   Jacinto Halim , [facility] 04/01/2021, 3:17 PM Please see Amion for pager number during day hours 7:00am-4:30pm

## 2021-04-01 NOTE — Progress Notes (Signed)
Trauma/Critical Care Follow Up Note  Subjective:    Overnight Issues:   Objective:  Vital signs for last 24 hours: Temp:  [97.8 F (36.6 C)-98.8 F (37.1 C)] 97.8 F (36.6 C) (08/01 0511) Pulse Rate:  [64-80] 66 (08/01 0511) Resp:  [14-18] 16 (08/01 0511) BP: (92-112)/(51-73) 105/68 (08/01 0511) SpO2:  [99 %-100 %] 99 % (08/01 0511)  Hemodynamic parameters for last 24 hours:    Intake/Output from previous day: 07/31 0701 - 08/01 0700 In: 1867.2 [P.O.:960; I.V.:10; Blood:630; IV Piggyback:267.2] Out: 2900 [Urine:2900]  Intake/Output this shift: No intake/output data recorded.  Vent settings for last 24 hours:    Physical Exam:  Gen: comfortable, no distress Neuro: non-focal exam HEENT: PERRL Neck: supple CV: RRR Pulm: unlabored breathing Abd: soft, apporpriately TTP, central wound c/d/I, R flank with some superficial necrotic tissue and thin fibrinous exudate, L flank with thin fibrinous exudate GU: clear yellow urine Extr: wwp, no edema   Results for orders placed or performed during the hospital encounter of 03/29/21 (from the past 24 hour(s))  Type and screen [facility]     Status: None (Preliminary result)   Collection Time: 03/31/21 10:00 AM  Result Value Ref Range   ABO/RH(D) O POS    Antibody Screen NEG    Sample Expiration 04/03/2021,2359    Unit Number [id number]    Blood Component Type RED CELLS,LR    Unit division 00    Status of Unit ISSUED    Transfusion Status OK TO TRANSFUSE    Crossmatch Result      Compatible Performed at [facility], [address], [place], [place] [address]   Prepare RBC (crossmatch)     Status: None   Collection Time: 03/31/21 10:08 AM  Result Value Ref Range   Order Confirmation      ORDER PROCESSED BY BLOOD BANK Performed at [facility], [address], [place], [place] [address]   CBC     Status: Abnormal   Collection Time: 03/31/21  4:43 PM  Result Value Ref Range   WBC  9.6 4.0 - 10.5 K/uL   RBC 3.50 (L) 4.22 - 5.81 MIL/uL   Hemoglobin 10.1 (L) 13.0 - 17.0 g/dL   HCT 56.3 (L) 87.5 - 64.3 %   MCV 86.0 80.0 - 100.0 fL   MCH 28.9 26.0 - 34.0 pg   MCHC 33.6 30.0 - 36.0 g/dL   RDW 32.9 51.8 - 84.1 %   Platelets 615 (H) 150 - 400 K/uL   nRBC 0.0 0.0 - 0.2 %  Surgical PCR screen     Status: None   Collection Time: 04/01/21 12:44 AM   Specimen: Nasal Mucosa; Nasal Swab  Result Value Ref Range   MRSA, PCR NEGATIVE NEGATIVE   Staphylococcus aureus NEGATIVE NEGATIVE  CBC with Differential/Platelet     Status: Abnormal   Collection Time: 04/01/21 12:55 AM  Result Value Ref Range   WBC 9.7 4.0 - 10.5 K/uL   RBC 3.31 (L) 4.22 - 5.81 MIL/uL   Hemoglobin 9.1 (L) 13.0 - 17.0 g/dL   HCT 66.0 (L) 63.0 - 16.0 %   MCV 86.4 80.0 - 100.0 fL   MCH 27.5 26.0 - 34.0 pg   MCHC 31.8 30.0 - 36.0 g/dL   RDW 10.9 32.3 - 55.7 %   Platelets 578 (H) 150 - 400 K/uL   nRBC 0.0 0.0 - 0.2 %   Neutrophils Relative % 66 %   Neutro  Abs 6.4 1.7 - 7.7 K/uL   Lymphocytes Relative 20 %   Lymphs Abs 2.0 0.7 - 4.0 K/uL   Monocytes Relative 9 %   Monocytes Absolute 0.9 0.1 - 1.0 K/uL   Eosinophils Relative 3 %   Eosinophils Absolute 0.3 0.0 - 0.5 K/uL   Basophils Relative 1 %   Basophils Absolute 0.1 0.0 - 0.1 K/uL   Immature Granulocytes 1 %   Abs Immature Granulocytes 0.07 0.00 - 0.07 K/uL  Comprehensive metabolic panel     Status: Abnormal   Collection Time: 04/01/21 12:55 AM  Result Value Ref Range   Sodium 138 135 - 145 mmol/L   Potassium 4.2 3.5 - 5.1 mmol/L   Chloride 103 98 - 111 mmol/L   CO2 27 22 - 32 mmol/L   Glucose, Bld 107 (H) 70 - 99 mg/dL   BUN 9 6 - 20 mg/dL   Creatinine, Ser 2.68 0.61 - 1.24 mg/dL   Calcium 8.3 (L) 8.9 - 10.3 mg/dL   Total Protein 6.1 (L) 6.5 - 8.1 g/dL   Albumin 2.0 (L) 3.5 - 5.0 g/dL   AST 90 (H) 15 - 41 U/L   ALT 75 (H) 0 - 44 U/L   Alkaline Phosphatase 129 (H) 38 - 126 U/L   Total Bilirubin 0.4 0.3 - 1.2 mg/dL   GFR, Estimated >34  >19 mL/min   Anion gap 8 5 - 15    Assessment & Plan:   Present on Admission:  Wound cellulitis  Abdominal wall cellulitis  Cellulitis    LOS: 1 day   Additional comments:I reviewed the patient's new clinical lab test results.   and I reviewed the patients new imaging test results.     S/P debridement B flank and abdominal wounds associated with group A strep infection by Dr. Dwain Sarna 7/16 -Abdominal wound remains clean and closed -Flank wound with eschar but no significant blanching erythema at this point. Normal WBC and afebrile. No surgical debridement indicated at this time, but I have ordered hydrotherapy today and santyl was ordered yesterday, both of which may help slough. WOCN consult placed 7/31 for further input; appreciate assistance in his care FEN - diet per primary team. Okay for regular diet from surgical standpoint.  DVT - SCDs, recommend trauma dosing of lovenox (30mg  BID) Dispo - med-surg    , MD Trauma & General Surgery Please use [email or web address] to contact on call provider  04/01/2021  *Care during the described time interval was provided by me. I have reviewed this patient's available data, including medical history, events of note, physical examination and test results as part of my evaluation.

## 2021-04-01 NOTE — Progress Notes (Signed)
Physical Therapy Wound Evaluation/ Treatment Patient Details  Name: Bryce [name] MRN: [id number] Date of Birth: Sep 25, 1985  Today's Date: 04/01/2021 Time: 1113-1200 Time Calculation (min): 47 min  Subjective  Subjective Assessment Subjective: Pt pleasant and agreeable to hydrotherapy. Communication vis video interpreter. Patient and Family Stated Goals: None stated Date of Onset:  ("Several days ago") Prior Treatments: I&D 7/16  Pain Score:  Pt tolerated well overall with premedication, however by end of session asking for additional pain meds. RN notified.   Wound Assessment  Wound / Incision (Open or Dehisced) 04/01/21 Non-pressure wound Flank Lower;Right Upper and lower wounds combined (Active)  Wound Image  Before debridement  After debridement 04/01/21 1309  Dressing Type ABD;Barrier Film (skin prep);Gauze (Comment);Moist to moist;Santyl 04/01/21 1309  Dressing Changed Changed 04/01/21 1309  Dressing Status Clean;Dry;Intact 04/01/21 1309  Dressing Change Frequency Daily 04/01/21 1309  Site / Wound Assessment Red;Yellow;Black 04/01/21 1309  % Wound base Red or Granulating 30% 04/01/21 1309  % Wound base Yellow/Fibrinous Exudate 20% 04/01/21 1309  % Wound base Black/Eschar 50% 04/01/21 1309  % Wound base Other/Granulation Tissue (Comment) 0% 04/01/21 1309  Peri-wound Assessment Intact 04/01/21 1309  Wound Length (cm) 14 cm 04/01/21 1309  Wound Width (cm) 11 cm 04/01/21 1309  Wound Depth (cm) 1 cm 04/01/21 1309  Wound Volume (cm^3) 154 cm^3 04/01/21 1309  Wound Surface Area (cm^2) 154 cm^2 04/01/21 1309  Tunneling (cm) 0 04/01/21 1309  Undermining (cm) 0 04/01/21 1309  Margins Unattached edges (unapproximated) 04/01/21 1309  Closure None 04/01/21 1309  Drainage Amount Minimal 04/01/21 1309  Drainage Description Purulent 04/01/21 1309  Treatment Debridement (Selective);Hydrotherapy (Pulse lavage);Packing (Saline gauze) 04/01/21 1309      Hydrotherapy Pulsed  lavage therapy - wound location: R flank Pulsed Lavage with Suction (psi): 8 psi Pulsed Lavage with Suction - Normal Saline Used: 1000 mL Pulsed Lavage Tip: Tip with splash shield Selective Debridement Selective Debridement - Location: R flank Selective Debridement - Tools Used: Forceps, Scalpel Selective Debridement - Tissue Removed: Black eschar and yellow unviable tissue    Wound Assessment and Plan  Wound Therapy - Assess/Plan/Recommendations Wound Therapy - Clinical Statement: Pt presents to hydrotherapy with worsening R flank wounds after prior admission in July. Debridement initiated this date and updated picture above at end of session. This patient will benefit from continued hydrotherapy for selective removal of unviable tissue, to decrease bioburden, and promote wound bed healing. Wound Therapy - Functional Problem List: Acute pain, decreased tolerance for OOB mobility Factors Delaying/Impairing Wound Healing: Infection - systemic/local Hydrotherapy Plan: Debridement, Dressing change, Patient/family education, Pulsatile lavage with suction Wound Therapy - Frequency: 6X / week Wound Therapy - Follow Up Recommendations: dressing changes by family/patient  Wound Therapy Goals- Improve the function of patient's integumentary system by progressing the wound(s) through the phases of wound healing (inflammation - proliferation - remodeling) by: Wound Therapy Goals - Improve the function of patient's integumentary system by progressing the wound(s) through the phases of wound healing by: Decrease Necrotic Tissue to: 20% Decrease Necrotic Tissue - Progress: Goal set today Increase Granulation Tissue to: 80% Increase Granulation Tissue - Progress: Goal set today Goals/treatment plan/discharge plan were made with and agreed upon by patient/family: Yes Time For Goal Achievement: 7 days Wound Therapy - Potential for Goals: Good  Goals will be updated until maximal potential achieved or  discharge criteria met.  Discharge criteria: when goals achieved, discharge from hospital, MD decision/surgical intervention, no progress towards goals, refusal/missing three consecutive treatments without notification  or medical reason.  GP     Charges PT Wound Care Charges $Wound Debridement up to 20 cm: < or equal to 20 cm $ Wound Debridement each add'l 20 sqcm: 7 $PT PLS Gun and Tip: 1 Supply $PT Hydrotherapy Visit: 1 Visit       Thelma Comp 04/01/2021, 1:27 PM

## 2021-04-01 NOTE — TOC CAGE-AID Note (Signed)
Transition of Care [facility]) - CAGE-AID Screening   Patient Details  Name: Bryce [name] MRN: [id number] Date of Birth: 1985/12/07  Transition of Care [facility]) CM/SW Contact:    Jatasia Gundrum C Tarpley-Carter, LCSWA Phone Number: 04/01/2021, 1:50 PM   Clinical Narrative:  Pt is unable to participate in Cage Aid.   Pharoah Goggins Tarpley-Carter, MSW, LCSW-A Pronouns:  She/Her/Hers [facility]itions of [facility] Clinical Social Worker Direct Number:  [phone] [email or web address]   CAGE-AID Screening: Substance Abuse Screening unable to be completed due to: : Patient unable to participate

## 2021-04-02 LAB — CBC WITH DIFFERENTIAL/PLATELET
Abs Immature Granulocytes: 0.05 10*3/uL (ref 0.00–0.07)
Basophils Absolute: 0.1 10*3/uL (ref 0.0–0.1)
Basophils Relative: 1 %
Eosinophils Absolute: 0.5 10*3/uL (ref 0.0–0.5)
Eosinophils Relative: 6 %
HCT: 28.7 % — ABNORMAL LOW (ref 39.0–52.0)
Hemoglobin: 9.5 g/dL — ABNORMAL LOW (ref 13.0–17.0)
Immature Granulocytes: 1 %
Lymphocytes Relative: 24 %
Lymphs Abs: 2.1 10*3/uL (ref 0.7–4.0)
MCH: 28.4 pg (ref 26.0–34.0)
MCHC: 33.1 g/dL (ref 30.0–36.0)
MCV: 85.7 fL (ref 80.0–100.0)
Monocytes Absolute: 0.7 10*3/uL (ref 0.1–1.0)
Monocytes Relative: 8 %
Neutro Abs: 5.5 10*3/uL (ref 1.7–7.7)
Neutrophils Relative %: 60 %
Platelets: 561 10*3/uL — ABNORMAL HIGH (ref 150–400)
RBC: 3.35 MIL/uL — ABNORMAL LOW (ref 4.22–5.81)
RDW: 13.2 % (ref 11.5–15.5)
WBC: 8.9 10*3/uL (ref 4.0–10.5)
nRBC: 0 % (ref 0.0–0.2)

## 2021-04-02 NOTE — Progress Notes (Signed)
Subjective: I seen and evaluated Mr. Bellucci at bedside. Spanish interpreter was used, [place] ID [id number]. Mr. Mcniel states he is feeling okay. He reports minimal pain of the abdomin early this morning but has since resolved. He denies any fever or chills. Denies nausea or vomiting. Wound and dressing was assessed at bedside. Mr. Tanguma also reports flaky dry skin of his LL extremities and hands. They are not painful and states he noticed they began on previous hospital visit.  Objective:  Vital signs in last 24 hours: Vitals:   04/01/21 2209 04/02/21 0128 04/02/21 0422 04/02/21 0731  BP: 102/61 109/65 103/66 (!) 102/59  Pulse: 74 74 61 63  Resp:   16 17  Temp: 97.8 F (36.6 C) (!) 97.5 F (36.4 C) 98.2 F (36.8 C) 98.5 F (36.9 C)  TempSrc: Oral Oral Oral Oral  SpO2: 100% 99% 99% 99%  Weight:      Height:       Physical Exam Constitutional:      General: He is awake.     Appearance: He is normal weight.  HENT:     Head: Normocephalic and atraumatic.  Abdominal:     General: Abdomen is flat.     Palpations: Abdomen is soft.     Tenderness: There is no right CVA tenderness or left CVA tenderness.     Comments: Incision located on hypogastric region is clean and dry. Minimal serosanguinous drainage noted on bandage. Bandage was changed at bedside.  Left flank wound was packed with damp gauze. No surrounding erythema, pain elicited with slight palpation of the surrounding wound. Upon removing dressing, granulation tissue formation noted and slosh noted around border of the wound. Wound dressing was changed at bedside  Right flank wound was packed with damp gauze. No surrounding erythema, pain elicited with slight palpation of the surrounding wound. Upon removing dressing, granulation tissue formation noted and slosh noted around border of the wound. Wound dressing was changed at bedside   Skin:    General: Skin is dry.     Findings: Wound present.     Comments: Skin of  the lower extremities and upper extremities flaky and peeling.   Neurological:     Mental Status: He is alert.  Psychiatric:        Behavior: Behavior is cooperative.     Assessment/Plan:  Active Problems:   Abdominal wall cellulitis   Wound cellulitis   Cellulitis  Group A strep soft tissue infection Left thigh Pyomyositis Patient reports some improvement in pain today.  Flank wounds appear unchanged from yesterday. Surgery team started hydrotherapy and decided against surgical debridement. Spoke with ID, they recommend MRI of left femour to reassess left thigh pyomyositis and check for resolution. -Continue Unasyn per ID recommendation -Will transition to oral Augmentin at DC per ID recommendation -Will FU w/ Dr. Synthia Innocent 1-2 weeks post DC -Blood cultures pending -Continue Santyl per surgery -Greatly appreciate surgery assistance and wound care assistance -Will consult with surgery about hydrotherapy duration   Normocytic anemia Patient was transfused 1 unit PRBC one day ago. He is currently hemodynamically stable. Current hemoglobin 9.5. Patient does appear to have iron deficiency with iron 16, sat 6.  Ferritin likely elevated in the setting of acute infection. -Will likely need iron supplementation in the near future -Trend CBC -Discontinued heparin  Prior to Admission Living Arrangement: Anticipated Discharge Location: Barriers to Discharge: Dispo: Anticipated discharge in approximately 1-2 day(s).   Dellis Filbert, MD 04/02/2021, 12:04 PM Pager:  [phone] After 5pm on weekdays and 1pm on weekends: On Call pager [phone]

## 2021-04-02 NOTE — Progress Notes (Signed)
Physical Therapy Wound Treatment Patient Details  Name: Ruddy Swire MRN: [id number] Date of Birth: 1986/06/16  Today's Date: 04/02/2021 Time: 1105-1203 Time Calculation (min): 58 min  Subjective  Subjective Assessment Subjective: Pt pleasant and agreeable to hydrotherapy. Communication via video interpreter. Patient and Family Stated Goals: None stated Date of Onset:  ("Several days ago" PTA) Prior Treatments: I&D 7/16  Pain Score:  Premedicated and tolerated treatment well.   Wound Assessment  Wound / Incision (Open or Dehisced) 04/01/21 Non-pressure wound Flank Lower;Right Upper and lower wounds combined (Active)  Wound Image   04/02/21 1222  Dressing Type ABD;Abdominal binder;Gauze (Comment);Moist to moist;Santyl 04/02/21 1222  Dressing Changed Changed 04/02/21 1222  Dressing Status Clean;Dry;Intact 04/02/21 1222  Dressing Change Frequency Daily 04/02/21 1222  Site / Wound Assessment Red;Yellow;Black 04/02/21 1222  % Wound base Red or Granulating 50% 04/02/21 1222  % Wound base Yellow/Fibrinous Exudate 40% 04/02/21 1222  % Wound base Black/Eschar 10% 04/02/21 1222  % Wound base Other/Granulation Tissue (Comment) 0% 04/02/21 1222  Peri-wound Assessment Intact 04/02/21 1222  Wound Length (cm) 14 cm 04/01/21 1309  Wound Width (cm) 11 cm 04/01/21 1309  Wound Depth (cm) 1 cm 04/01/21 1309  Wound Volume (cm^3) 154 cm^3 04/01/21 1309  Wound Surface Area (cm^2) 154 cm^2 04/01/21 1309  Tunneling (cm) 0 04/01/21 1309  Undermining (cm) 0 04/01/21 1309  Margins Unattached edges (unapproximated) 04/02/21 1222  Closure None 04/02/21 1222  Drainage Amount Minimal 04/02/21 1222  Drainage Description Purulent 04/02/21 1222  Treatment Debridement (Selective);Hydrotherapy (Pulse lavage);Packing (Saline gauze) 04/02/21 1222   Hydrotherapy Pulsed lavage therapy - wound location: R flank Pulsed Lavage with Suction (psi): 4 psi Pulsed Lavage with Suction - Normal Saline Used: 1000  mL Pulsed Lavage Tip: Tip with splash shield Selective Debridement Selective Debridement - Location: R flank Selective Debridement - Tools Used: Forceps, Scalpel Selective Debridement - Tissue Removed: Black eschar and yellow unviable tissue    Wound Assessment and Plan  Wound Therapy - Assess/Plan/Recommendations Wound Therapy - Clinical Statement: Wound appearance improved this session and picture updated after further debridement. Pt tolerated treatment well with minimal complaints of pain. This patient will benefit from continued hydrotherapy for selective removal of unviable tissue, to decrease bioburden, and promote wound bed healing. Wound Therapy - Functional Problem List: Acute pain, decreased tolerance for OOB mobility Factors Delaying/Impairing Wound Healing: Infection - systemic/local Hydrotherapy Plan: Debridement, Dressing change, Patient/family education, Pulsatile lavage with suction Wound Therapy - Frequency: 6X / week Wound Therapy - Follow Up Recommendations: dressing changes by family/patient  Wound Therapy Goals- Improve the function of patient's integumentary system by progressing the wound(s) through the phases of wound healing (inflammation - proliferation - remodeling) by: Wound Therapy Goals - Improve the function of patient's integumentary system by progressing the wound(s) through the phases of wound healing by: Decrease Necrotic Tissue to: 20% Decrease Necrotic Tissue - Progress: Progressing toward goal Increase Granulation Tissue to: 80% Increase Granulation Tissue - Progress: Progressing toward goal Goals/treatment plan/discharge plan were made with and agreed upon by patient/family: Yes Time For Goal Achievement: 7 days Wound Therapy - Potential for Goals: Good  Goals will be updated until maximal potential achieved or discharge criteria met.  Discharge criteria: when goals achieved, discharge from hospital, MD decision/surgical intervention, no progress  towards goals, refusal/missing three consecutive treatments without notification or medical reason.  GP     Charges PT Wound Care Charges $Wound Debridement up to 20 cm: < or equal to 20 cm $ Wound  Debridement each add'l 20 sqcm: 3 $PT PLS Gun and Tip: 1 Supply $PT Hydrotherapy Visit: 1 Visit       Thelma Comp 04/02/2021, 12:50 PM  Rolinda Roan, PT, DPT Acute Rehabilitation Services Pager: [phone] Office: [phone]

## 2021-04-03 ENCOUNTER — Ambulatory Visit: Payer: Self-pay | Admitting: Infectious Disease

## 2021-04-03 ENCOUNTER — Inpatient Hospital Stay (HOSPITAL_COMMUNITY): Payer: Self-pay

## 2021-04-03 LAB — CBC WITH DIFFERENTIAL/PLATELET
Abs Immature Granulocytes: 0.07 10*3/uL (ref 0.00–0.07)
Basophils Absolute: 0.1 10*3/uL (ref 0.0–0.1)
Basophils Relative: 1 %
Eosinophils Absolute: 0.5 10*3/uL (ref 0.0–0.5)
Eosinophils Relative: 5 %
HCT: 29.9 % — ABNORMAL LOW (ref 39.0–52.0)
Hemoglobin: 9.7 g/dL — ABNORMAL LOW (ref 13.0–17.0)
Immature Granulocytes: 1 %
Lymphocytes Relative: 22 %
Lymphs Abs: 2.2 10*3/uL (ref 0.7–4.0)
MCH: 27.8 pg (ref 26.0–34.0)
MCHC: 32.4 g/dL (ref 30.0–36.0)
MCV: 85.7 fL (ref 80.0–100.0)
Monocytes Absolute: 0.8 10*3/uL (ref 0.1–1.0)
Monocytes Relative: 8 %
Neutro Abs: 6.2 10*3/uL (ref 1.7–7.7)
Neutrophils Relative %: 63 %
Platelets: 524 10*3/uL — ABNORMAL HIGH (ref 150–400)
RBC: 3.49 MIL/uL — ABNORMAL LOW (ref 4.22–5.81)
RDW: 13.2 % (ref 11.5–15.5)
WBC: 9.9 10*3/uL (ref 4.0–10.5)
nRBC: 0 % (ref 0.0–0.2)

## 2021-04-03 IMAGING — MR MR FEMUR*L* WO/W CM
8 series · 40 of 40 positions shown · IV contrast (Gadavist)
Comparison: [DATE]

CLINICAL DATA: Left thigh high myositis.

EXAM:
MR OF THE LEFT LOWER EXTREMITY WITHOUT AND WITH CONTRAST
TECHNIQUE: Multiplanar, multisequence MR imaging of the left thigh was
performed both before and after administration of intravenous
contrast.
CONTRAST:  6mL GADAVIST GADOBUTROL 1 MMOL/ML IV SOLN

[Series 7: composed cor t1_comp_filt · coronal · left · 6.0mm · 1.25mm/px · 3 of 30 slices shown]
[im 1/30]
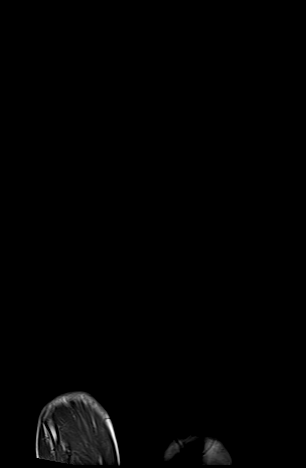
[im 15/30]
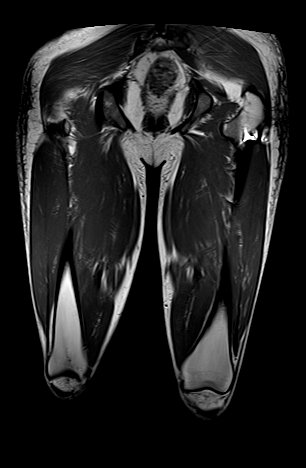
[im 30/30]
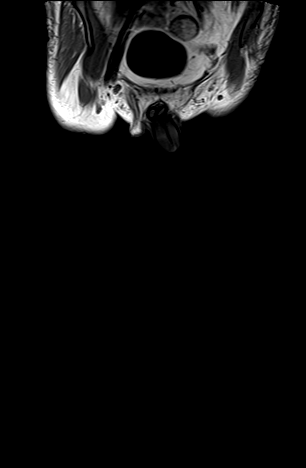

[Series 11: composed cor stir_comp_filt · coronal · left · 6.0mm · 0.91mm/px · 3 of 30 slices shown]
[im 1/30]
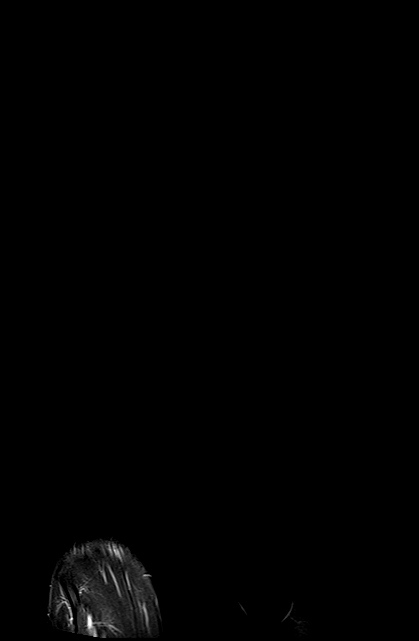
[im 15/30]
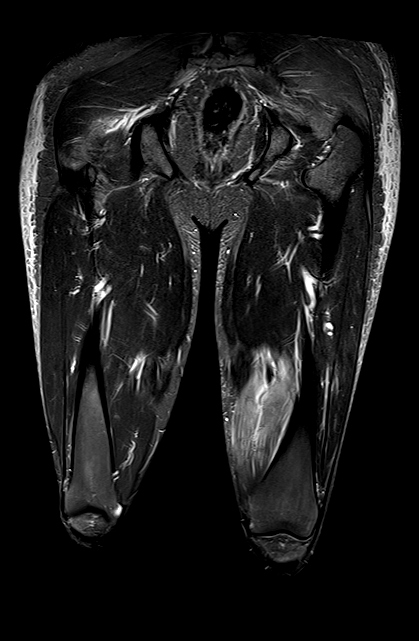
[im 30/30]
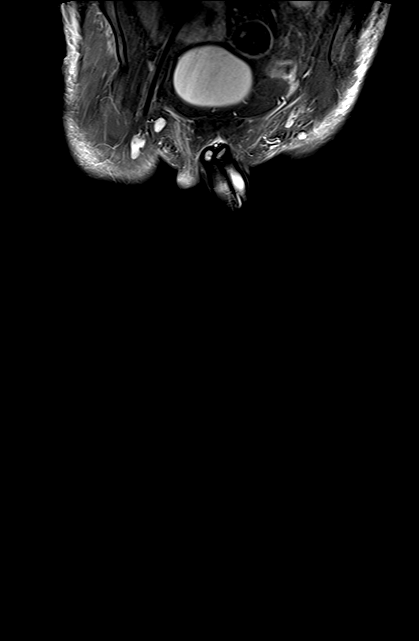

[Series 14: ax t1_comp · axial · left · 5.0mm · 0.74mm/px · z∈[-364,+77]mm · 7 of 76 slices shown]
[im 1/76]
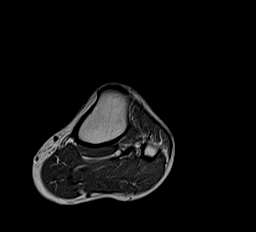
[im 13/76]
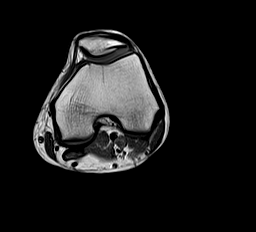
[im 26/76]
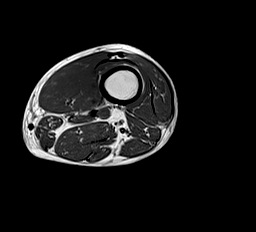
[im 38/76]
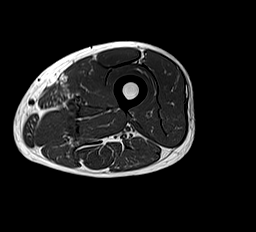
[im 51/76]
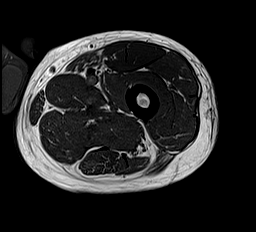
[im 63/76]
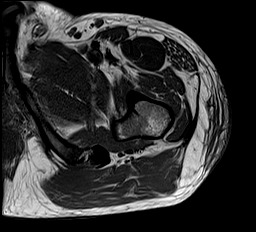
[im 76/76]
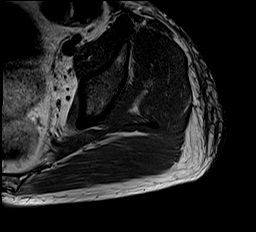

[Series 17: T2 · axial · left · 5.0mm · 0.91mm/px · z∈[-364,+77]mm · 7 of 76 slices shown (1 of 2)]
[im 1/76]
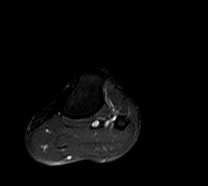
[im 13/76]
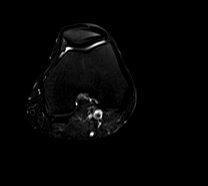
[im 26/76]
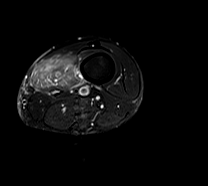
[im 38/76]
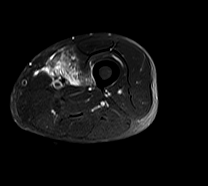
[im 51/76]
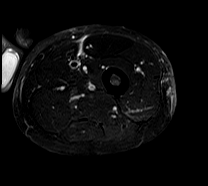
[im 63/76]
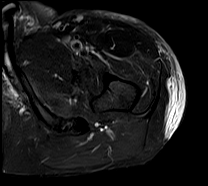
[im 76/76]
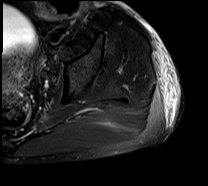

[Series 20: T2 · sagittal · left · 5.0mm · 1.15mm/px · 3 of 32 slices shown (2 of 2)]
[im 1/32]
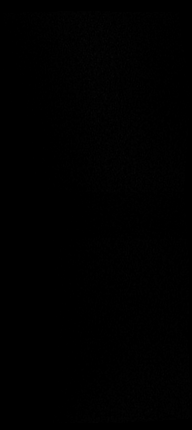
[im 16/32]
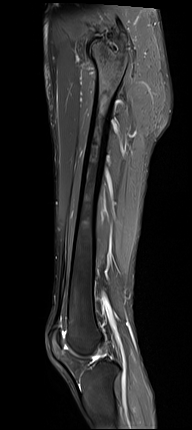
[im 32/32]
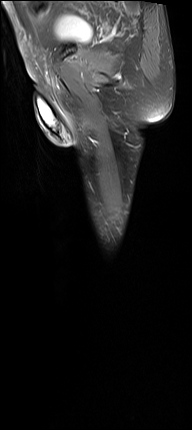

[Series 23: T1 fat-sat · axial · left · 5.0mm · 0.91mm/px · z∈[-364,+77]mm · 7 of 76 slices shown (1 of 3)]
[im 1/76]
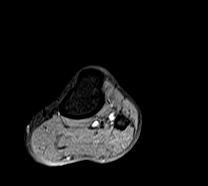
[im 13/76]
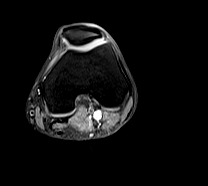
[im 26/76]
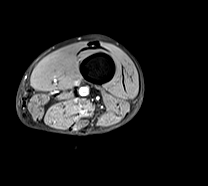
[im 38/76]
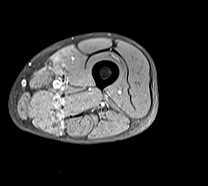
[im 51/76]
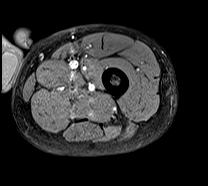
[im 63/76]
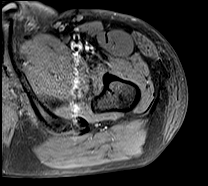
[im 76/76]
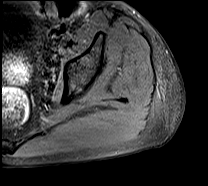

[Series 26: T1 fat-sat · axial · left · 5.0mm · 0.91mm/px · z∈[-364,+77]mm · 7 of 76 slices shown (2 of 3)]
[im 1/76]
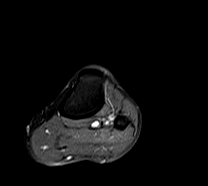
[im 13/76]
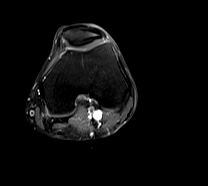
[im 26/76]
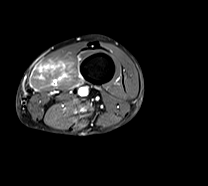
[im 38/76]
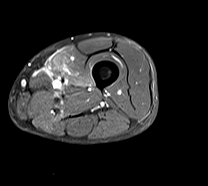
[im 51/76]
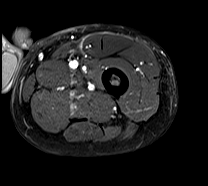
[im 63/76]
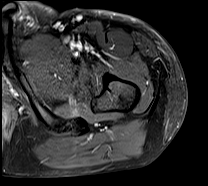
[im 76/76]
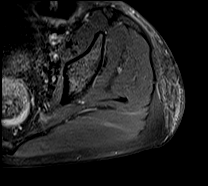

[Series 29: T1 fat-sat · coronal · left · 6.0mm · 1.25mm/px · 3 of 30 slices shown (3 of 3)]
[im 1/30]
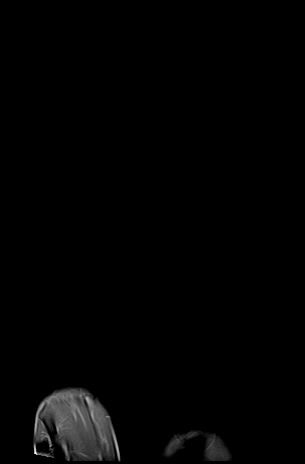
[im 15/30]
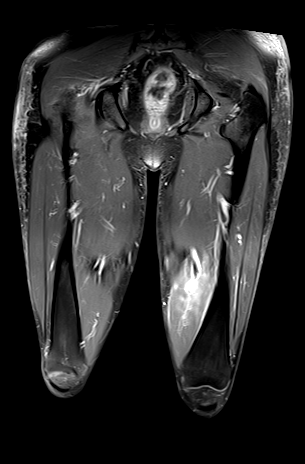
[im 30/30]
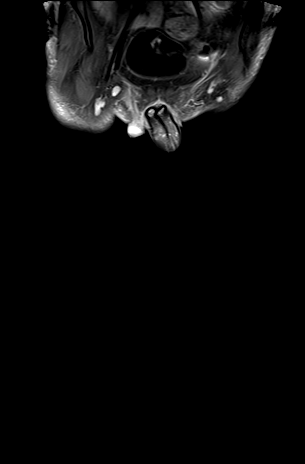

[40 of 40 positions shown; findings below may reference images not displayed]

FINDINGS: Bones/Joint/Cartilage

No findings of osteomyelitis or substantial joint effusion in the
hips or knees.

Ligaments

N/A

Muscles and Tendons

The vastus medialis abscess distally measures 2.1 by 1.7 by 4.0 cm
(volume = 7.5 cm^3), including the thick enhancing rind.
Previously this measured 1.2 by 1.3 by 2.8 cm (volume = 2.3 cm^3).
Increased surrounding inflammatory stranding and enhancement in the
muscle compatible with myositis. Low-level edema and enhancement
also tracks along the superficial fascia margin of the biceps
femoris muscle.

Low-level band of edema in the gluteus maximus muscle on images 1-2
of series 17 with minimal enhancement, not readily apparent on
prior, may represent low-grade inflammation versus injection.

Soft tissues

There is subcutaneous edema and low-grade enhancement lateral to the
left hip which is improved from prior, and the more generalized
edema along the pelvis and perineum has essentially resolved. There
is similar subcutaneous edema lateral to the contralateral (right)
hip as well. The subcutaneous cellulitis in the thigh has
substantially improved from [DATE] as well.
IMPRESSION: 1. Increase in size of the distal left vastus medialis abscess,
currently 7.5 cubic cm including the thick enhancing rind
(previously 2.3 cm on [DATE]. Increase in surrounding myositis.
2. Subcutaneous edema lateral to both hips, but the more generalized
subcutaneous edema along the pelvis and left thigh has essentially
resolved.
3. Trace band of edema signal in the left gluteus maximus muscle
could reflect low-grade inflammation or intramuscular injection
site.

## 2021-04-03 MED ORDER — GADOBUTROL 1 MMOL/ML IV SOLN
6.0000 mL | Freq: Once | INTRAVENOUS | Status: AC | PRN
  Administered 2021-04-03: 6 mL via INTRAVENOUS

## 2021-04-03 NOTE — Progress Notes (Signed)
   Subjective: I seen and evaluated Bryce [name] at bedside.  Hydrotherapy team was present to begin his wound care.  Bryce [name] states he is doing much better.  He appears to be in no acute distress.   Objective:  Vital signs in last 24 hours: Vitals:   04/02/21 2058 04/02/21 2349 04/03/21 0409 04/03/21 1044  BP: 114/66 106/62 108/66 104/62  Pulse: 66 60 60 63  Resp: 16 16 16 18   Temp: 98.4 F (36.9 C) 97.9 F (36.6 C) 97.6 F (36.4 C) 97.8 F (36.6 C)  TempSrc: Oral Oral Oral Oral  SpO2: 100% 100% 98% 100%  Weight:      Height:       Physical Exam HENT:     Head: Normocephalic and atraumatic.  Abdominal:     General: Abdomen is flat.     Palpations: Abdomen is soft.     Comments: Left flank wound has minimal slough around edges of the ulcer with a central granulation tissue.  Right flank wound packed with damp dressing.  Neurological:     Mental Status: He is alert.  Psychiatric:        Behavior: Behavior is cooperative.    Left Flank:    Assessment/Plan:  Active Problems:   Abdominal wall cellulitis   Wound cellulitis   Cellulitis  Group A strep soft tissue infection Left thigh Pyomyositis Patient reports some improvement in pain today.  Flank wounds appear to be improving from yesterday. Surgery team is continuing hydrotherapy. Spoke with ID, they recommend MRI of left femour to reassess left thigh pyomyositis and check for resolution. -MRI of Left Femur Pending results -Continue Unasyn per ID recommendation -Will transition to oral Augmentin at DC per ID recommendation -Will FU w/ Dr. 1-2 weeks post DC -Continue Santyl per surgery -Greatly appreciate surgery assistance and wound care assistance -Continued hydrotherapy per surgery   Normocytic anemia Patient was transfused 1 unit PRBC two days ago. He is currently hemodynamically stable. Current hemoglobin 9.7. Patient does appear to have iron deficiency with iron 16, sat 6.  Ferritin likely  elevated in the setting of acute infection. -Will likely need iron supplementation in the near future -Trend CBC -Discontinued heparin  Prior to Admission Living Arrangement: Anticipated Discharge Location: Barriers to Discharge: Dispo: Anticipated discharge in approximately 2-3 day(s).   Synthia Innocent, MD 04/03/2021, 2:34 PM Pager: [phone] After 5pm on weekdays and 1pm on weekends: On Call pager [phone]

## 2021-04-03 NOTE — Progress Notes (Signed)
Physical Therapy Wound Treatment Patient Details  Name: Bryce [name] MRN: [id number] Date of Birth: 07-Jan-1986  Today's Date: 04/03/2021 Time: 4854-6270 Time Calculation (min): 30 min  Subjective  Subjective Assessment Subjective: Pt pleasant and agreeable to hydrotherapy. Communication via video interpreter. Patient and Family Stated Goals: None stated Date of Onset:  ("Several days ago" PTA) Prior Treatments: I&D 7/16  Pain Score:  6/10 faces pain scale  Wound Assessment  Wound / Incision (Open or Dehisced) 04/01/21 Non-pressure wound Flank Lower;Right Upper and lower wounds combined (Active)  Dressing Type ABD;Barrier Film (skin prep);Gauze (Comment);Moist to moist;Santyl 04/03/21 1505  Dressing Changed Changed 04/03/21 1505  Dressing Status Clean;Dry;Intact 04/03/21 1505  Dressing Change Frequency Daily 04/03/21 1505  Site / Wound Assessment Red;Yellow 04/03/21 1505  % Wound base Red or Granulating 70% 04/03/21 1505  % Wound base Yellow/Fibrinous Exudate 30% 04/03/21 1505  % Wound base Black/Eschar 0% 04/03/21 1505  % Wound base Other/Granulation Tissue (Comment) 0% 04/03/21 1505  Peri-wound Assessment Intact 04/03/21 1505  Wound Length (cm) 14 cm 04/01/21 1309  Wound Width (cm) 11 cm 04/01/21 1309  Wound Depth (cm) 1 cm 04/01/21 1309  Wound Volume (cm^3) 154 cm^3 04/01/21 1309  Wound Surface Area (cm^2) 154 cm^2 04/01/21 1309  Tunneling (cm) 0 04/01/21 1309  Undermining (cm) 0 04/01/21 1309  Margins Unattached edges (unapproximated) 04/03/21 1505  Closure None 04/03/21 1505  Drainage Amount Minimal 04/03/21 1505  Drainage Description Serosanguineous 04/03/21 1505  Treatment Debridement (Selective);Hydrotherapy (Pulse lavage);Packing (Saline gauze) 04/03/21 1505      Hydrotherapy Pulsed lavage therapy - wound location: R flank Pulsed Lavage with Suction (psi): 4 psi Pulsed Lavage with Suction - Normal Saline Used: 1000 mL Pulsed Lavage Tip: Tip with splash  shield Selective Debridement Selective Debridement - Location: R flank Selective Debridement - Tools Used: Forceps, Scalpel Selective Debridement - Tissue Removed: yellow unviable tissue    Wound Assessment and Plan  Wound Therapy - Assess/Plan/Recommendations Wound Therapy - Clinical Statement: Wound appearance improved this session. Pt tolerated treatment well overall however reports increased pain this session. This patient will benefit from continued hydrotherapy for selective removal of unviable tissue, to decrease bioburden, and promote wound bed healing. Wound Therapy - Functional Problem List: Acute pain, decreased tolerance for OOB mobility Factors Delaying/Impairing Wound Healing: Infection - systemic/local Hydrotherapy Plan: Debridement, Dressing change, Patient/family education, Pulsatile lavage with suction Wound Therapy - Frequency: 6X / week Wound Therapy - Follow Up Recommendations: dressing changes by family/patient  Wound Therapy Goals- Improve the function of patient's integumentary system by progressing the wound(s) through the phases of wound healing (inflammation - proliferation - remodeling) by: Wound Therapy Goals - Improve the function of patient's integumentary system by progressing the wound(s) through the phases of wound healing by: Decrease Necrotic Tissue to: 20% Decrease Necrotic Tissue - Progress: Progressing toward goal Increase Granulation Tissue to: 80% Increase Granulation Tissue - Progress: Progressing toward goal Goals/treatment plan/discharge plan were made with and agreed upon by patient/family: Yes Time For Goal Achievement: 7 days Wound Therapy - Potential for Goals: Good  Goals will be updated until maximal potential achieved or discharge criteria met.  Discharge criteria: when goals achieved, discharge from hospital, MD decision/surgical intervention, no progress towards goals, refusal/missing three consecutive treatments without notification or  medical reason.  GP     Charges PT Wound Care Charges $Wound Debridement up to 20 cm: < or equal to 20 cm $ Wound Debridement each add'l 20 sqcm: 2 $PT PLS Gun and Tip:  1 Supply $PT Hydrotherapy Visit: 1 Visit       Thelma Comp 04/03/2021, 3:12 PM  Rolinda Roan, PT, DPT Acute Rehabilitation Services Pager: [phone] Office: [phone]

## 2021-04-04 LAB — CBC WITH DIFFERENTIAL/PLATELET
Abs Immature Granulocytes: 0.08 10*3/uL — ABNORMAL HIGH (ref 0.00–0.07)
Basophils Absolute: 0.1 10*3/uL (ref 0.0–0.1)
Basophils Relative: 1 %
Eosinophils Absolute: 0.6 10*3/uL — ABNORMAL HIGH (ref 0.0–0.5)
Eosinophils Relative: 6 %
HCT: 29.7 % — ABNORMAL LOW (ref 39.0–52.0)
Hemoglobin: 9.5 g/dL — ABNORMAL LOW (ref 13.0–17.0)
Immature Granulocytes: 1 %
Lymphocytes Relative: 24 %
Lymphs Abs: 2.3 10*3/uL (ref 0.7–4.0)
MCH: 27.6 pg (ref 26.0–34.0)
MCHC: 32 g/dL (ref 30.0–36.0)
MCV: 86.3 fL (ref 80.0–100.0)
Monocytes Absolute: 0.7 10*3/uL (ref 0.1–1.0)
Monocytes Relative: 7 %
Neutro Abs: 5.7 10*3/uL (ref 1.7–7.7)
Neutrophils Relative %: 61 %
Platelets: 445 10*3/uL — ABNORMAL HIGH (ref 150–400)
RBC: 3.44 MIL/uL — ABNORMAL LOW (ref 4.22–5.81)
RDW: 13.2 % (ref 11.5–15.5)
WBC: 9.5 10*3/uL (ref 4.0–10.5)
nRBC: 0 % (ref 0.0–0.2)

## 2021-04-04 LAB — CULTURE, BLOOD (ROUTINE X 2)
Culture: NO GROWTH
Culture: NO GROWTH
Special Requests: ADEQUATE
Special Requests: ADEQUATE

## 2021-04-04 MED ORDER — SODIUM CHLORIDE 0.9 % IV SOLN: INTRAVENOUS | Status: DC

## 2021-04-04 NOTE — Consult Note (Signed)
Reason for Consult:Left thigh abscess Referring Physician: Charissa [name] Time called: 1521 Time at bedside: [address] Sharp is an 35 y.o. male.  HPI: Bryce [name] was readmitted 1 week ago with exacerbation of his abd wounds. He was seen about 3 weeks ago by orthopedic surgery when he was noted to have a small abscess in the distal medial left thigh. He was improving at that time and the decision was made not to do surgery. He was discharged home but returned 2/2 his abd. His leg hurts intermittently, sometimes severely, and walking does seem to make it worse. MRI yesterday showed increased abscess size and orthopedic surgery was reconsulted today.  Past Medical History:  Diagnosis Date   Dyspnea    Known health problems: none 03/14/2021    Past Surgical History:  Procedure Laterality Date   I & D EXTREMITY Bilateral 03/16/2021   Procedure: IRRIGATION AND DEBRIDEMENT FLANKS POSSIBLE ABDOMINAL WALL;  Surgeon: Bryce Loron, MD;  Location: [facility] OR;  Service: General;  Laterality: Bilateral;   NO PAST SURGERIES      Family History  Family history unknown: Yes    Social History:  reports that he has quit smoking. His smoking use included cigarettes. He has never been exposed to tobacco smoke. He has never used smokeless tobacco. He reports current alcohol use. He reports previous drug use. Drugs: Marijuana and Cocaine.  Allergies: No Known Allergies  Medications: I have reviewed the patient's current medications.  Results for orders placed or performed during the hospital encounter of 03/29/21 (from the past 48 hour(s))  CBC with Differential/Platelet     Status: Abnormal   Collection Time: 04/03/21 12:44 AM  Result Value Ref Range   WBC 9.9 4.0 - 10.5 K/uL   RBC 3.49 (L) 4.22 - 5.81 MIL/uL   Hemoglobin 9.7 (L) 13.0 - 17.0 g/dL   HCT 02.7 (L) 74.1 - 28.7 %   MCV 85.7 80.0 - 100.0 fL   MCH 27.8 26.0 - 34.0 pg   MCHC 32.4 30.0 - 36.0 g/dL   RDW 86.7 67.2 - 09.4 %    Platelets 524 (H) 150 - 400 K/uL   nRBC 0.0 0.0 - 0.2 %   Neutrophils Relative % 63 %   Neutro Abs 6.2 1.7 - 7.7 K/uL   Lymphocytes Relative 22 %   Lymphs Abs 2.2 0.7 - 4.0 K/uL   Monocytes Relative 8 %   Monocytes Absolute 0.8 0.1 - 1.0 K/uL   Eosinophils Relative 5 %   Eosinophils Absolute 0.5 0.0 - 0.5 K/uL   Basophils Relative 1 %   Basophils Absolute 0.1 0.0 - 0.1 K/uL   Immature Granulocytes 1 %   Abs Immature Granulocytes 0.07 0.00 - 0.07 K/uL    Comment: Performed at [facility], [address] [place], [place] [address]  CBC with Differential/Platelet     Status: Abnormal   Collection Time: 04/04/21  8:27 AM  Result Value Ref Range   WBC 9.5 4.0 - 10.5 K/uL   RBC 3.44 (L) 4.22 - 5.81 MIL/uL   Hemoglobin 9.5 (L) 13.0 - 17.0 g/dL   HCT 83.6 (L) 62.9 - 47.6 %   MCV 86.3 80.0 - 100.0 fL   MCH 27.6 26.0 - 34.0 pg   MCHC 32.0 30.0 - 36.0 g/dL   RDW 54.6 50.3 - 54.6 %   Platelets 445 (H) 150 - 400 K/uL   nRBC 0.0 0.0 - 0.2 %   Neutrophils Relative % 61 %  Neutro Abs 5.7 1.7 - 7.7 K/uL   Lymphocytes Relative 24 %   Lymphs Abs 2.3 0.7 - 4.0 K/uL   Monocytes Relative 7 %   Monocytes Absolute 0.7 0.1 - 1.0 K/uL   Eosinophils Relative 6 %   Eosinophils Absolute 0.6 (H) 0.0 - 0.5 K/uL   Basophils Relative 1 %   Basophils Absolute 0.1 0.0 - 0.1 K/uL   Immature Granulocytes 1 %   Abs Immature Granulocytes 0.08 (H) 0.00 - 0.07 K/uL    Comment: Performed at [facility], [address], [place], [place] [address]    MR FEMUR LEFT W WO CONTRAST  Result Date: 04/04/2021 CLINICAL DATA:  Left thigh high myositis. EXAM: MR OF THE LEFT LOWER EXTREMITY WITHOUT AND WITH CONTRAST TECHNIQUE: Multiplanar, multisequence MR imaging of the left thigh was performed both before and after administration of intravenous contrast. CONTRAST:  19mL GADAVIST GADOBUTROL 1 MMOL/ML IV SOLN COMPARISON:  03/18/2021 FINDINGS: Bones/Joint/Cartilage No findings of osteomyelitis or substantial  joint effusion in the hips or knees. Ligaments N/A Muscles and Tendons The vastus medialis abscess distally measures 2.1 by 1.7 by 4.0 cm (volume = 7.5 cm^3), including the thick enhancing rind. Previously this measured 1.2 by 1.3 by 2.8 cm (volume = 2.3 cm^3). Increased surrounding inflammatory stranding and enhancement in the muscle compatible with myositis. Low-level edema and enhancement also tracks along the superficial fascia margin of the biceps femoris muscle. Low-level band of edema in the gluteus maximus muscle on images 1-2 of series 17 with minimal enhancement, not readily apparent on prior, may represent low-grade inflammation versus injection. Soft tissues There is subcutaneous edema and low-grade enhancement lateral to the left hip which is improved from prior, and the more generalized edema along the pelvis and perineum has essentially resolved. There is similar subcutaneous edema lateral to the contralateral (right) hip as well. The subcutaneous cellulitis in the thigh has substantially improved from 03/18/2021 as well. IMPRESSION: 1. Increase in size of the distal left vastus medialis abscess, currently 7.5 cubic cm including the thick enhancing rind (previously 2.3 cm on 03/18/2021. Increase in surrounding myositis. 2. Subcutaneous edema lateral to both hips, but the more generalized subcutaneous edema along the pelvis and left thigh has essentially resolved. 3. Trace band of edema signal in the left gluteus maximus muscle could reflect low-grade inflammation or intramuscular injection site. Electronically Signed   By: Bryce [name] M.D.   On: 04/04/2021 08:32    Review of Systems  Constitutional:  Negative for chills, diaphoresis and fever.  HENT:  Negative for ear discharge, ear pain, hearing loss and tinnitus.   Eyes:  Negative for photophobia and pain.  Respiratory:  Negative for cough and shortness of breath.   Cardiovascular:  Negative for chest pain.  Gastrointestinal:   Positive for abdominal pain. Negative for nausea and vomiting.  Genitourinary:  Negative for dysuria, flank pain, frequency and urgency.  Musculoskeletal:  Positive for arthralgias (Left distal medial thigh). Negative for back pain, myalgias and neck pain.  Neurological:  Negative for dizziness and headaches.  Hematological:  Does not bruise/bleed easily.  Psychiatric/Behavioral:  The patient is not nervous/anxious.   Blood pressure 111/69, pulse 61, temperature 97.8 F (36.6 C), temperature source Oral, resp. rate 19, height 5\' 5"  (1.651 m), weight 59.3 kg, SpO2 100 %. Physical Exam Constitutional:      General: He is not in acute distress.    Appearance: He is well-developed. He is not diaphoretic.  HENT:  Head: Normocephalic and atraumatic.  Eyes:     General: No scleral icterus.       Right eye: No discharge.        Left eye: No discharge.     Conjunctiva/sclera: Conjunctivae normal.  Cardiovascular:     Rate and Rhythm: Normal rate and regular rhythm.  Pulmonary:     Effort: Pulmonary effort is normal. No respiratory distress.  Musculoskeletal:     Cervical back: Normal range of motion.     Comments: LLE No traumatic wounds, ecchymosis, or rash  Mod TTP distal medial thigh w/mass effect  No knee or ankle effusion  Knee stable to varus/ valgus and anterior/posterior stress  Sens DPN, SPN, TN intact  Motor EHL, ext, flex, evers 5/5  DP 1+, PT 1+, No significant edema  Skin:    General: Skin is warm and dry.  Neurological:     Mental Status: He is alert.  Psychiatric:        Mood and Affect: Mood normal.        Behavior: Behavior normal.    Assessment/Plan: Left thigh abscess -- Dr. Everardo Pacific to review MRI and decide on best way to proceed. Will make NPO after MN in case of procedure tomorrow.    Bryce Caldron, PA-C Orthopedic Surgery [phone] 04/04/2021, 3:53 PM

## 2021-04-04 NOTE — TOC Initial Note (Addendum)
Transition of Care [facility]) - Initial/Assessment Note    Patient Details  Name: Bryce [name] MRN: [id number] Date of Birth: September 10, 1985  Transition of Care [facility]) CM/SW Contact:    Kingsley Plan, RN Phone Number: 04/04/2021, 5:01 PM  Clinical Narrative:                 Spoke to patient at bedside with [employer] interpreter. Marquita Palms [id number]  Patient from home, confirmed face sheet information. Patient has roommates but no one to assist him with wound care.   Asked bedside nurse will provide wound education.   Patient not insured. No PCP  . Dr Ruben Im will see if patient can be seen in IM Clinic on Monday.   Patient has an appointment at [facility] April 18, 2021, called they do not have any appointments sooner.   NCM will order dressing supplies with [employer] [facility]. NCM will ask nurse to provide some dressing supplies at discharge.   NCM will assist patient with scripts through [facility] and [facility] program. Patient can afford co pay of $3 per prescription.   Gave referral for charity [facility] to [facility] with [facility], they declined referral. Patient and MD aware.   Explained all of above to patient via interpreter and he voiced understanding.   Per Dr Ruben Im  patient can go to [facility] on [address]. They they are walk in, open Monday-Saturday and they are aware of his wounds. They take patients with no insurance, he pays cash. So he can go there in the mean time, until his follow up appointments as an option as well.     Expected Discharge Plan: Home/Self Care     Patient Goals and CMS Choice Patient states their goals for this hospitalization and ongoing recovery are:: to go home CMS Medicare.gov Compare Post Acute Care list provided to:: Patient Choice offered to / list presented to : Patient  Expected Discharge Plan and Services Expected Discharge Plan: Home/Self Care       Living arrangements for the past 2 months: Single  Family Home                 DME Arranged: Other see comment (dressing supplies) DME Agency: [employer]       HH Arranged: RN HH Agency: [facility] (now [facility]) Date HH Agency Contacted: 04/04/21 Time HH Agency Contacted: 1300 Representative spoke with at [facility] Agency: Stacie denied  Prior Living Arrangements/Services Living arrangements for the past 2 months: Single Family Home Lives with:: Roommate Patient language and need for interpreter reviewed:: Yes Do you feel safe going back to the place where you live?: Yes            Criminal Activity/Legal Involvement Pertinent to Current Situation/Hospitalization: No - Comment as needed  Activities of Daily Living      Permission Sought/Granted   Permission granted to share information with : No              Emotional Assessment Appearance:: Appears stated age Attitude/Demeanor/Rapport: Engaged Affect (typically observed): Accepting Orientation: : Oriented to Self, Oriented to Place, Oriented to  Time, Oriented to Situation Alcohol / Substance Use: Not Applicable Psych Involvement: No (comment)  Admission diagnosis:  Wound cellulitis [L03.90] Cellulitis [L03.90] Patient Active Problem List   Diagnosis Date Noted   Cellulitis 03/31/2021   Wound cellulitis 03/30/2021   Volume overload 03/16/2021   Hypoalbuminemia 03/16/2021   Acute respiratory failure (HCC) 03/15/2021  Group A Streptococcal Bacteremia 03/14/2021   Sepsis (HCC) 03/13/2021   Abdominal wall cellulitis 03/13/2021   Acute renal failure (HCC) 03/13/2021   Lactic acidosis 03/13/2021   PCP:  Oneita Hurt, No Pharmacy:   [facility] - [place]  - [address] [place]) [place] [address] Phone: [phone] Fax: [phone]  [facility] [address] [place] [address] Phone: [phone] Fax: [phone]     Social Determinants of Health (SDOH)  Interventions    Readmission Risk Interventions No flowsheet data found.

## 2021-04-04 NOTE — Progress Notes (Addendum)
Physical Therapy Wound Treatment Patient Details  Name: Bryce [name] MRN: [id number] Date of Birth: 1986/07/23  Today's Date: 04/04/2021 Time: 2952-8413 Time Calculation (min): 36 min  Subjective  Subjective Assessment Subjective: Pt pleasant and agreeable to hydrotherapy. Communication via video interpreter. Patient and Family Stated Goals: None stated Date of Onset:  ("Several days ago" PTA) Prior Treatments: I&D 7/16  Pain Score:  Pt tolerated treatment well with premedication.   Wound Assessment  Wound / Incision (Open or Dehisced) 04/01/21 Non-pressure wound Flank Lower;Right Upper and lower wounds combined (Active)  Dressing Type ABD;Barrier Film (skin prep);Gauze (Comment);Moist to moist;Santyl 04/04/21 1309  Dressing Changed Changed 04/04/21 1309  Dressing Status Clean;Dry;Intact 04/04/21 1309  Dressing Change Frequency Daily 04/04/21 1309  Site / Wound Assessment Red;Yellow 04/04/21 1309  % Wound base Red or Granulating 85% 04/04/21 1309  % Wound base Yellow/Fibrinous Exudate 15% 04/04/21 1309  % Wound base Black/Eschar 0% 04/04/21 1309  % Wound base Other/Granulation Tissue (Comment) 0% 04/04/21 1309  Peri-wound Assessment Intact 04/04/21 1309  Wound Length (cm) 14 cm 04/01/21 1309  Wound Width (cm) 11 cm 04/01/21 1309  Wound Depth (cm) 1 cm 04/01/21 1309  Wound Volume (cm^3) 154 cm^3 04/01/21 1309  Wound Surface Area (cm^2) 154 cm^2 04/01/21 1309  Tunneling (cm) 0 04/01/21 1309  Undermining (cm) 0 04/01/21 1309  Margins Unattached edges (unapproximated) 04/04/21 1309  Closure None 04/04/21 1309  Drainage Amount Minimal 04/04/21 1309  Drainage Description Serosanguineous 04/04/21 1309  Treatment Debridement (Selective);Hydrotherapy (Pulse lavage);Packing (Saline gauze) 04/04/21 1309      Hydrotherapy Pulsed lavage therapy - wound location: R flank Pulsed Lavage with Suction (psi): 4 psi Pulsed Lavage with Suction - Normal Saline Used: 1000 mL Pulsed  Lavage Tip: Tip with splash shield Selective Debridement Selective Debridement - Location: R flank Selective Debridement - Tools Used: Forceps, Scalpel, Scissors Selective Debridement - Tissue Removed: yellow unviable tissue    Wound Assessment and Plan  Wound Therapy - Assess/Plan/Recommendations Wound Therapy - Clinical Statement: Wound appearance continues to improve this session. Noted minor pockets of purulent drainage unconvered in areas between the 2 large wounds. Medical team present at end of session. Hydrotherapy will likely sign off tomorrow after treatment. This patient will benefit from continued hydrotherapy for selective removal of unviable tissue, to decrease bioburden, and promote wound bed healing. Wound Therapy - Functional Problem List: Acute pain, decreased tolerance for OOB mobility Factors Delaying/Impairing Wound Healing: Infection - systemic/local Hydrotherapy Plan: Debridement, Dressing change, Patient/family education, Pulsatile lavage with suction Wound Therapy - Frequency: 6X / week Wound Therapy - Follow Up Recommendations: dressing changes by family/patient  Wound Therapy Goals- Improve the function of patient's integumentary system by progressing the wound(s) through the phases of wound healing (inflammation - proliferation - remodeling) by: Wound Therapy Goals - Improve the function of patient's integumentary system by progressing the wound(s) through the phases of wound healing by: Decrease Necrotic Tissue to: 10% Decrease Necrotic Tissue - Progress: Updated due to goal met Increase Granulation Tissue to: 90% Increase Granulation Tissue - Progress: Updated due to goal met Goals/treatment plan/discharge plan were made with and agreed upon by patient/family: Yes Time For Goal Achievement: 7 days Wound Therapy - Potential for Goals: Good  Goals will be updated until maximal potential achieved or discharge criteria met.  Discharge criteria: when goals  achieved, discharge from hospital, MD decision/surgical intervention, no progress towards goals, refusal/missing three consecutive treatments without notification or medical reason.  GP     Charges PT  Wound Care Charges $Wound Debridement up to 20 cm: < or equal to 20 cm $ Wound Debridement each add'l 20 sqcm: 1 $PT PLS Gun and Tip: 1 Supply $PT Hydrotherapy Visit: 1 Visit       Thelma Comp 04/04/2021, 1:26 PM  Rolinda Roan, PT, DPT Acute Rehabilitation Services Pager: [phone] Office: [phone]

## 2021-04-04 NOTE — Progress Notes (Addendum)
   Subjective: I seen and evaluated Bryce [name] at bedside.  Spanish interpreter present Bryce [name] ID [id number].  Hydrotherapy team was present during interview, right flank wound improving.  Bryce [name] states that he is feeling better and reports minimal pain.   Objective:  Vital signs in last 24 hours: Vitals:   04/03/21 1537 04/03/21 2055 04/03/21 2344 04/04/21 0339  BP: 109/71 118/77 108/69 108/69  Pulse: 70 70 70 62  Resp: 19 17 18 17   Temp: 97.9 F (36.6 C) 98.2 F (36.8 C) 98.5 F (36.9 C) 97.8 F (36.6 C)  TempSrc: Oral Oral Oral Oral  SpO2: 100% 100% 100% 100%  Weight:      Height:       Physical Exam HENT:     Head: Normocephalic and atraumatic.  Abdominal:     Comments: Right flank wound has minimal slough along border of wound. Centralized granulation tissue. Minimal pus formation surrounding granulation tissue.  Skin:    General: Skin is dry.     Comments: Dry, flaky and peeling skin present on bilateral hands.   Neurological:     General: No focal deficit present.     Mental Status: He is alert.  Psychiatric:        Mood and Affect: Mood normal.        Behavior: Behavior normal. Behavior is cooperative.     Assessment/Plan:  Active Problems:   Abdominal wall cellulitis   Wound cellulitis   Cellulitis  Group A strep soft tissue infection Left thigh Pyomyositis Patient reports some improvement in pain today.  Flank wounds appear to be improving from yesterday. Surgery team is continuing hydrotherapy. Patient is continuing to receive hydrotherapy, per PT states uncertainty of duration, but anticipates one to two more days of hydrotherapy remaining. MRI results of the left femur reveals: Increase in size of the distal left vastus medialis abscess, currently 7.5 cubic cm including the thick enhancing rind (previously 2.3 cm on 03/18/2021. Increase in surrounding myositis. Subcutaneous edema lateral to both hips, but the more generalized  subcutaneous edema along the pelvis and left thigh has essentially resolved. Trace band of edema signal in the left gluteus maximus muscle could reflect low-grade inflammation or intramuscular injection site. -Orthopedics consulted for enlarging myositis of left femur -Continue Unasyn per ID recommendation -Will transition to oral Augmentin at DC per ID recommendation -Will FU w/ Dr. 03/20/2021 1-2 weeks post DC -Continue Santyl per surgery -Greatly appreciate surgery assistance and wound care assistance -Continued hydrotherapy per surgery   Normocytic anemia Patient was transfused 1 unit PRBC two days ago. He is currently hemodynamically stable. Current hemoglobin 9.7. Patient does appear to have iron deficiency with iron 16, sat 6.  Ferritin likely elevated in the setting of acute infection. -Will likely need iron supplementation in the near future -Trend CBC -Discontinued heparin     Prior to Admission Living Arrangement: Anticipated Discharge Location: Barriers to Discharge: Dispo: Anticipated discharge in approximately 1-2 day(s).   Synthia Innocent, MD 04/04/2021, 7:06 AM Pager: [phone] After 5pm on weekdays and 1pm on weekends: On Call pager [phone]

## 2021-04-05 ENCOUNTER — Inpatient Hospital Stay (HOSPITAL_COMMUNITY): Payer: Self-pay

## 2021-04-05 DIAGNOSIS — L02416 Cutaneous abscess of left lower limb: Secondary | ICD-10-CM

## 2021-04-05 LAB — CBC WITH DIFFERENTIAL/PLATELET
Abs Immature Granulocytes: 0.06 10*3/uL (ref 0.00–0.07)
Basophils Absolute: 0.1 10*3/uL (ref 0.0–0.1)
Basophils Relative: 1 %
Eosinophils Absolute: 0.6 10*3/uL — ABNORMAL HIGH (ref 0.0–0.5)
Eosinophils Relative: 6 %
HCT: 29.8 % — ABNORMAL LOW (ref 39.0–52.0)
Hemoglobin: 9.7 g/dL — ABNORMAL LOW (ref 13.0–17.0)
Immature Granulocytes: 1 %
Lymphocytes Relative: 21 %
Lymphs Abs: 2.1 10*3/uL (ref 0.7–4.0)
MCH: 27.7 pg (ref 26.0–34.0)
MCHC: 32.6 g/dL (ref 30.0–36.0)
MCV: 85.1 fL (ref 80.0–100.0)
Monocytes Absolute: 0.8 10*3/uL (ref 0.1–1.0)
Monocytes Relative: 8 %
Neutro Abs: 6.3 10*3/uL (ref 1.7–7.7)
Neutrophils Relative %: 63 %
Platelets: 465 10*3/uL — ABNORMAL HIGH (ref 150–400)
RBC: 3.5 MIL/uL — ABNORMAL LOW (ref 4.22–5.81)
RDW: 13.2 % (ref 11.5–15.5)
WBC: 9.8 10*3/uL (ref 4.0–10.5)
nRBC: 0 % (ref 0.0–0.2)

## 2021-04-05 MED ORDER — LIDOCAINE HCL (PF) 1 % IJ SOLN
INTRAMUSCULAR | Status: AC
  Filled 2021-04-05: qty 30

## 2021-04-05 MED ORDER — OXYCODONE HCL 5 MG PO TABS
5.0000 mg | ORAL_TABLET | ORAL | Status: AC
  Administered 2021-04-05: 5 mg via ORAL
  Filled 2021-04-05: qty 1

## 2021-04-05 MED ORDER — NAPROXEN 250 MG PO TABS
500.0000 mg | ORAL_TABLET | Freq: Two times a day (BID) | ORAL | Status: DC
  Administered 2021-04-06 – 2021-04-07 (×3): 500 mg via ORAL
  Filled 2021-04-05 (×3): qty 2

## 2021-04-05 MED ORDER — MIDAZOLAM HCL 2 MG/2ML IJ SOLN
INTRAMUSCULAR | Status: AC
  Filled 2021-04-05: qty 4

## 2021-04-05 MED ORDER — FENTANYL CITRATE (PF) 100 MCG/2ML IJ SOLN
INTRAMUSCULAR | Status: AC
  Filled 2021-04-05: qty 4
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
## 2021-04-05 NOTE — Progress Notes (Signed)
Physical Therapy Wound Treatment  Will Sign off of this patient for Hydrotherapy at this time.  Wound can be managed well by nursing.  Patient Details  Name: Bryce [name] MRN: [id number] Date of Birth: 06-Aug-1986  Today's Date: 04/05/2021 Time: 2947-6546 Time Calculation (min): 32 min  Subjective  Subjective Assessment Subjective: Pt pleasant and agreeable to hydrotherapy. Communication via video interpreter. Patient and Family Stated Goals: None stated Date of Onset:  ("Several days ago" PTA) Prior Treatments: I&D 7/16  Pain Score:  0-2/10  premedicated  Wound Assessment     Wound / Incision (Open or Dehisced) 04/01/21 Non-pressure wound Flank Lower;Right Upper and lower wounds combined (Active)  Wound Image   04/02/21 1222  Dressing Type Abdominal binder;ABD;Gauze (Comment);Moist to dry;Normal saline moist dressing;Impregnated gauze (petrolatum);Santyl 04/05/21 1452  Dressing Changed Changed 04/05/21 1452  Dressing Status Clean;Dry;Intact 04/05/21 1452  Dressing Change Frequency Daily 04/05/21 1452  Site / Wound Assessment Red;Yellow 04/04/21 1309  % Wound base Red or Granulating 95% 04/05/21 1452  % Wound base Yellow/Fibrinous Exudate 5% 04/05/21 1452  % Wound base Black/Eschar 0% 04/05/21 1452  % Wound base Other/Granulation Tissue (Comment) 0% 04/05/21 1452  Peri-wound Assessment Intact 04/05/21 1452  Wound Length (cm) 14 cm 04/01/21 1309  Wound Width (cm) 11 cm 04/01/21 1309  Wound Depth (cm) 1 cm 04/01/21 1309  Wound Volume (cm^3) 154 cm^3 04/01/21 1309  Wound Surface Area (cm^2) 154 cm^2 04/01/21 1309  Tunneling (cm) 0 04/01/21 1309  Undermining (cm) 0 04/01/21 1309  Margins Unattached edges (unapproximated) 04/05/21 1452  Closure None 04/04/21 1309  Drainage Amount Minimal 04/05/21 1452  Drainage Description Serosanguineous 04/05/21 1452  Treatment Cleansed;Debridement (Selective);Hydrotherapy (Pulse lavage);Packing (Saline gauze) 04/05/21 1452       Hydrotherapy Pulsed lavage therapy - wound location: R flank Pulsed Lavage with Suction (psi): 4 psi Pulsed Lavage with Suction - Normal Saline Used: 1000 mL Pulsed Lavage Tip: Tip with splash shield Selective Debridement Selective Debridement - Location: R flank Selective Debridement - Tools Used: Forceps, Scalpel, Scissors Selective Debridement - Tissue Removed: yellow unviable tissue    Wound Assessment and Plan  Wound Therapy - Assess/Plan/Recommendations Wound Therapy - Clinical Statement: Wound has improved to a level we will d/c to nursing to continue with wound healing.  Little pockets are now expressing clear fluids, no further debridement needs.  Will sign off. Wound Therapy - Functional Problem List: Acute pain, decreased tolerance for OOB mobility Factors Delaying/Impairing Wound Healing: Infection - systemic/local Hydrotherapy Plan: Debridement, Dressing change, Patient/family education, Pulsatile lavage with suction Wound Therapy - Frequency: 6X / week Wound Therapy - Follow Up Recommendations: dressing changes by family/patient  Wound Therapy Goals- Improve the function of patient's integumentary system by progressing the wound(s) through the phases of wound healing (inflammation - proliferation - remodeling) by: Wound Therapy Goals - Improve the function of patient's integumentary system by progressing the wound(s) through the phases of wound healing by: Decrease Necrotic Tissue to: 10% Decrease Necrotic Tissue - Progress: Met Increase Granulation Tissue to: 90% Increase Granulation Tissue - Progress: Met Goals/treatment plan/discharge plan were made with and agreed upon by patient/family: Yes Time For Goal Achievement: 7 days Wound Therapy - Potential for Goals: Good  Goals will be updated until maximal potential achieved or discharge criteria met.  Discharge criteria: when goals achieved, discharge from hospital, MD decision/surgical intervention, no progress  towards goals, refusal/missing three consecutive treatments without notification or medical reason.  GP     Charges PT Wound Care Charges $  Wound Debridement up to 20 cm: < or equal to 20 cm $ Wound Debridement each add'l 20 sqcm: 1 $PT PLS Gun and Tip: 1 Supply $PT Hydrotherapy Visit: 1 Visit     04/05/2021  Bryce Carne., PT Acute Rehabilitation Services [phone]  (pager) [phone]  (office)  Bryce [name] 04/05/2021, 2:56 PM
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
## 2021-04-05 NOTE — Procedures (Signed)
Interventional Radiology Procedure Note  Date of Procedure: 04/05/2021  Procedure: US guided core needle biopsy of left thigh intramuscular lesion   Findings:  1. US demonstrated solid lesion in medial left thigh, no fluid component to drain. Successful US guided core needle biopsy of the lesion with samples sent in formalin and saline for pathology and microbiology analysis.    Complications: No immediate complications noted.   Estimated Blood Loss: minimal  Follow-up and Recommendations: 1. Follow up path and micro results.    Olive Bass, MD  Vascular & Interventional Radiology  04/05/2021 11:47 AM

## 2021-04-05 NOTE — Progress Notes (Signed)
   Subjective: I seen and evaluated Bryce [name] at bedside. Spanish interpreter present, Lewie Chamber ID# [id number]. He states that he is feeling okay. He states slight discomfort in his abdomen with movement in the bed. However, the pain in his abdomen is worse at nightfall around midnight and prevents a restful night. He denies fever ot chills. Denies chest pain, SOB, or lower extremity pain/discomfort. He acknowledges that he had the US guided core needle biopsy of his left vastus medialis procedure earlier today. Denies pain of the left leg at this time.  Objective:  Vital signs in last 24 hours: Vitals:   04/05/21 1120 04/05/21 1125 04/05/21 1130 04/05/21 1247  BP: 111/79 109/70 105/65 106/63  Pulse: 64 60 (!) 58 70  Resp: 16 16 14 16   Temp:    98.5 F (36.9 C)  TempSrc:    Oral  SpO2: 100% 100% 100% 100%  Weight:      Height:       Physical Exam HENT:     Head: Normocephalic and atraumatic.  Cardiovascular:     Rate and Rhythm: Normal rate and regular rhythm.     Heart sounds: Normal heart sounds.  Pulmonary:     Effort: Pulmonary effort is normal.     Breath sounds: Normal breath sounds.  Abdominal:     Comments: Could not visualize bilateral flank  wounds due to dressing placed by hydrotherapy team.  Musculoskeletal:     Comments: Left vastus medialis abscess- no overlying erythema. A bandage placed over area of biopsy.   Skin:    General: Skin is dry.     Comments: Diffuse dry and flaky skin.  Neurological:     General: No focal deficit present.     Mental Status: He is alert.  Psychiatric:        Behavior: Behavior normal. Behavior is cooperative.     Assessment/Plan:  Active Problems:   Abdominal wall cellulitis   Wound cellulitis   Cellulitis  Group A strep soft tissue infection Left thigh Pyomyositis Patient reports some improvement in pain today.  Flank wounds appear to be improving from yesterday. Surgery team is continuing hydrotherapy. Patient is continuing  to receive hydrotherapy, per PT states uncertainty of duration, but anticipates one to two more days of hydrotherapy remaining. MRI results of the left femur reveals: Increase in size of the distal left vastus medialis abscess, currently 7.5 cubic cm including the thick enhancing rind (previously 2.3 cm on 03/18/2021. Orthopedics was consulted for enlarging myositis of left vastus medialis (see note). IR performing 03/20/2021 guided core needle biopsy of left thigh intramuscular lesion today. --Core needle biopsy results pending  -Continue Unasyn per ID recommendation -Will transition to oral Augmentin at DC per ID recommendation -Will FU w/ Dr. [place] 1-2 weeks post DC -Continue Santyl per surgery -Greatly appreciate Orthopedic surgery and IR assistance   Normocytic anemia Patient was transfused 1 unit PRBC four days ago. He is currently hemodynamically stable. Current hemoglobin 9.7. Patient does appear to have iron deficiency with iron 16, sat 6.  Ferritin likely elevated in the setting of acute infection. -Will likely need iron supplementation in the near future -Trend CBC -Discontinued heparin  Prior to Admission Living Arrangement: Anticipated Discharge Location: Barriers to Discharge: Dispo: Anticipated discharge in approximately 2-3 day(s).   Synthia Innocent, MD 04/05/2021, 5:36 PM Pager: [phone] After 5pm on weekdays and 1pm on weekends: On Call pager [phone]

## 2021-04-06 DIAGNOSIS — L02416 Cutaneous abscess of left lower limb: Secondary | ICD-10-CM | POA: Diagnosis present

## 2021-04-06 DIAGNOSIS — D649 Anemia, unspecified: Secondary | ICD-10-CM

## 2021-04-06 DIAGNOSIS — B954 Other streptococcus as the cause of diseases classified elsewhere: Secondary | ICD-10-CM | POA: Diagnosis present

## 2021-04-06 DIAGNOSIS — A491 Streptococcal infection, unspecified site: Secondary | ICD-10-CM | POA: Diagnosis present

## 2021-04-06 DIAGNOSIS — D75838 Other thrombocytosis: Secondary | ICD-10-CM | POA: Diagnosis present

## 2021-04-06 LAB — CBC WITH DIFFERENTIAL/PLATELET
Abs Immature Granulocytes: 0.08 10*3/uL — ABNORMAL HIGH (ref 0.00–0.07)
Basophils Absolute: 0.1 10*3/uL (ref 0.0–0.1)
Basophils Relative: 1 %
Eosinophils Absolute: 0.4 10*3/uL (ref 0.0–0.5)
Eosinophils Relative: 4 %
HCT: 30.3 % — ABNORMAL LOW (ref 39.0–52.0)
Hemoglobin: 9.9 g/dL — ABNORMAL LOW (ref 13.0–17.0)
Immature Granulocytes: 1 %
Lymphocytes Relative: 16 %
Lymphs Abs: 1.8 10*3/uL (ref 0.7–4.0)
MCH: 27.6 pg (ref 26.0–34.0)
MCHC: 32.7 g/dL (ref 30.0–36.0)
MCV: 84.4 fL (ref 80.0–100.0)
Monocytes Absolute: 0.8 10*3/uL (ref 0.1–1.0)
Monocytes Relative: 7 %
Neutro Abs: 8.2 10*3/uL — ABNORMAL HIGH (ref 1.7–7.7)
Neutrophils Relative %: 71 %
Platelets: 462 10*3/uL — ABNORMAL HIGH (ref 150–400)
RBC: 3.59 MIL/uL — ABNORMAL LOW (ref 4.22–5.81)
RDW: 13 % (ref 11.5–15.5)
WBC: 11.3 10*3/uL — ABNORMAL HIGH (ref 4.0–10.5)
nRBC: 0 % (ref 0.0–0.2)

## 2021-04-06 LAB — BASIC METABOLIC PANEL
Anion gap: 9 (ref 5–15)
BUN: 12 mg/dL (ref 6–20)
CO2: 25 mmol/L (ref 22–32)
Calcium: 8.6 mg/dL — ABNORMAL LOW (ref 8.9–10.3)
Chloride: 101 mmol/L (ref 98–111)
Creatinine, Ser: 0.7 mg/dL (ref 0.61–1.24)
GFR, Estimated: >60 mL/min (ref >60)
Glucose, Bld: 100 mg/dL — ABNORMAL HIGH (ref 70–99)
Potassium: 3.8 mmol/L (ref 3.5–5.1)
Sodium: 135 mmol/L (ref 135–145)

## 2021-04-06 MED ORDER — SODIUM CHLORIDE 0.9 % IV SOLN
250.0000 mg | Freq: Every day | INTRAVENOUS | Status: AC
  Administered 2021-04-06 – 2021-04-07 (×2): 250 mg via INTRAVENOUS
  Filled 2021-04-06 (×2): qty 20

## 2021-04-06 MED ORDER — RIVAROXABAN 10 MG PO TABS
10.0000 mg | ORAL_TABLET | Freq: Every day | ORAL | Status: DC
  Administered 2021-04-06 – 2021-04-07 (×2): 10 mg via ORAL
  Filled 2021-04-06 (×2): qty 1

## 2021-04-06 NOTE — Progress Notes (Addendum)
Subjective: I seen and evaluated Mr. Bryce [name] at bedside.  Spanish interpreter via phone Alecia ID [phone].  Mr. Bryce [name] states that he is feeling fine.  He reports some left leg pain following the needle core biopsy procedure yesterday.  He states the pain is worse when ambulating.  He reports reduced pain level overnight and reports that the pain medicine, naproxen, helped.  He was informed that hydrotherapy treatments ended yesterday and he acknowledges.  Currently waiting for biopsy results, he acknowledges.  Objective:  Vital signs in last 24 hours: Vitals:   04/05/21 1247 04/05/21 2008 04/06/21 0410 04/06/21 0801  BP: 106/63 129/84 106/64 106/68  Pulse: 70 73 (!) 56 (!) 53  Resp: 16 17 15 16   Temp: 98.5 F (36.9 C) 98.3 F (36.8 C) 98.2 F (36.8 C) 98.2 F (36.8 C)  TempSrc: Oral Oral Oral Oral  SpO2: 100% 100% 100% 100%  Weight:      Height:       Physical Exam HENT:     Head: Normocephalic and atraumatic.  Cardiovascular:     Rate and Rhythm: Normal rate and regular rhythm.     Heart sounds: Normal heart sounds, S1 normal and S2 normal.  Pulmonary:     Effort: Pulmonary effort is normal.     Breath sounds: Normal breath sounds.  Abdominal:     Tenderness: There is no right CVA tenderness or left CVA tenderness.     Comments: Bilateral flank wounds are dressed in bandage. No surrounding erythema noted. No tenderness with palpation of surrounding tissue. Area is clean and dry.  Incision on hypogastric region. Clean and dry. Healed well. Sutures in place. No surrounding erythema.   Musculoskeletal:     Right lower leg: No edema.     Left lower leg: No edema.     Comments: Left vastus medialis tenderness with palpation of solid mass. No overlying erythema. Bandage placed on needle biopsy site.  Skin:    Comments: Skin of the bilateral feet are dry and peeling.   Neurological:     General: No focal deficit present.     Mental Status: He is alert.  Psychiatric:         Behavior: Behavior is cooperative.     Assessment/Plan:  Principal Problem:   Abscess of left thigh Active Problems:   Open wound of abdominal wall, lateral, complicated   Reactive thrombocytosis   Infection due to Streptococcus pyogenes   Normocytic anemia   Group A strep soft tissue infection, recent bacteremia Left thigh myositis and abscess Patient reports some improvement in pain today.  Flank wounds appear to be improving from yesterday. Surgery team is has discontinued hydrotherapy and concluded bedside wound changes are appropriate going forward.  MRI results of the left femur reveals: Increase in size of the distal left vastus medialis abscess, currently 7.5 cubic cm including the thick enhancing rind (previously 2.3 cm on 03/18/2021. Increase in surrounding myositis. Subcutaneous edema lateral to both hips, but the more generalized subcutaneous edema along the pelvis and left thigh has essentially resolved. Trace band of edema signal in the left gluteus maximus muscle could reflect low-grade inflammation or intramuscular injection site. IR performed 03/20/2021 abscess drainage/core needle biopsy 8/5, lesion demonstrates a solid appearing lesion w/o fluid component amendable for percutaneous drainage. Tissue from core needle biopsy was submitted to both pathology and microbiology for further analysis.  -Pending pathology and microbiology results -Continue Unasyn per ID recommendation -Will transition to oral Augmentin at DC -Will  FU w/ Dr. Synthia Innocent 1-2 weeks post DC -Continue Santyl per surgery -Greatly appreciate surgery assistance and wound care assistance -Nurse management for wound care dressing change x3 daily, moist to moist per surgery team   Normocytic anemia Likely multifactorial with acute illness/inflammation and some iron deficiency. Patient was transfused 1 unit PRBC four days ago. He is currently hemodynamically stable. Current hemoglobin 9.9. Patient does appear to have  iron deficiency with iron 16, sat 6.  Ferritin likely elevated in the setting of acute infection.  - IV iron should be ok now that acute infection and bacteremia are resolved - DVT prophylaxis with xarelto 10mg  daily ok as he has no active bleeding. Still at risk for DVT given inflammatory state and he is not moving much out of bed.  Prior to Admission Living Arrangement: Anticipated Discharge Location: Barriers to Discharge: Dispo: Anticipated discharge in approximately 1-2 day(s).   , MD 04/06/2021, 11:30 AM Pager: [phone] After 5pm on weekdays and 1pm on weekends: On Call pager [phone]

## 2021-04-07 LAB — CBC WITH DIFFERENTIAL/PLATELET
Abs Immature Granulocytes: 0.06 10*3/uL (ref 0.00–0.07)
Basophils Absolute: 0.1 10*3/uL (ref 0.0–0.1)
Basophils Relative: 1 %
Eosinophils Absolute: 0.4 10*3/uL (ref 0.0–0.5)
Eosinophils Relative: 4 %
HCT: 31 % — ABNORMAL LOW (ref 39.0–52.0)
Hemoglobin: 10.3 g/dL — ABNORMAL LOW (ref 13.0–17.0)
Immature Granulocytes: 1 %
Lymphocytes Relative: 22 %
Lymphs Abs: 2 10*3/uL (ref 0.7–4.0)
MCH: 28.1 pg (ref 26.0–34.0)
MCHC: 33.2 g/dL (ref 30.0–36.0)
MCV: 84.5 fL (ref 80.0–100.0)
Monocytes Absolute: 0.8 10*3/uL (ref 0.1–1.0)
Monocytes Relative: 9 %
Neutro Abs: 5.9 10*3/uL (ref 1.7–7.7)
Neutrophils Relative %: 63 %
Platelets: 447 10*3/uL — ABNORMAL HIGH (ref 150–400)
RBC: 3.67 MIL/uL — ABNORMAL LOW (ref 4.22–5.81)
RDW: 13.2 % (ref 11.5–15.5)
WBC: 9.1 10*3/uL (ref 4.0–10.5)
nRBC: 0 % (ref 0.0–0.2)

## 2021-04-07 MED ORDER — AMOXICILLIN-POT CLAVULANATE 875-125 MG PO TABS: 1.0000 | ORAL_TABLET | Freq: Two times a day (BID) | ORAL | 0 refills | Status: AC

## 2021-04-07 MED ORDER — OXYCODONE HCL 5 MG PO TABS: 5.0000 mg | ORAL_TABLET | ORAL | 0 refills | Status: AC | PRN

## 2021-04-07 MED ORDER — NAPROXEN 500 MG PO TABS: 500.0000 mg | ORAL_TABLET | Freq: Two times a day (BID) | ORAL | 0 refills | Status: AC

## 2021-04-07 NOTE — Progress Notes (Signed)
Nsg Discharge Note  Extensive discharge teaching done, regarding medications and wound care. Per patient, not able to always go to wound clinic. Interpreter used to assist in teaching. Patient waiting for transportation at this time.  Admit Date:  03/29/2021 Discharge date: 04/07/2021   Bryce [name] to be D/C'd Home per MD order.  AVS completed.  Copy for chart, and copy for patient signed, and dated. Patient/caregiver able to verbalize understanding.  Discharge Medication: Allergies as of 04/07/2021   No Known Allergies      Medication List     STOP taking these medications    HYDROcodone-acetaminophen 5-325 MG tablet Commonly known as: NORCO/VICODIN       TAKE these medications    amoxicillin-clavulanate 875-125 MG tablet Commonly known as: AUGMENTIN Take 1 tablet by mouth every 12 (twelve) hours for 14 days. Start taking on: April 08, 2021   naproxen 500 MG tablet Commonly known as: NAPROSYN Take 1 tablet (500 mg total) by mouth 2 (two) times daily with a meal for 14 days.   oxyCODONE 5 MG immediate release tablet Commonly known as: Oxy IR/ROXICODONE Take 1 tablet (5 mg total) by mouth every 4 (four) hours as needed for up to 5 days for moderate pain.               Durable Medical Equipment  (From admission, onward)           Start     Ordered   04/04/21 1722  For home use only DME Other see comment  Once       Comments: saline solution; 2x4 gauze pads; ABD pads; Barrier film  Question:  Length of Need  Answer:  6 Months   04/04/21 1722              Discharge Care Instructions  (From admission, onward)           Start     Ordered   04/07/21 0000  Change dressing (specify)       Comments: Wound care to right flank wound: Cleanse with NS, pat dry. Apply collagenase, top with saline moistened gauze (opened), top with ABD pad and secure with tape. Change daily and PRN   04/07/21 1433            Discharge Assessment: Vitals:    04/07/21 0949 04/07/21 1506  BP: 107/68 112/73  Pulse: 63 (!) 58  Resp: 16 17  Temp: 97.7 F (36.5 C) 98 F (36.7 C)  SpO2: 100% 100%   Skin clean, dry and intact without evidence of skin break down, no evidence of skin tears noted. IV catheter discontinued intact. Site without signs and symptoms of complications - no redness or edema noted at insertion site, patient denies c/o pain - only slight tenderness at site.  Dressing with slight pressure applied.  D/c Instructions-Education: Discharge instructions given to patient/family with verbalized understanding. D/c education completed with patient/family including follow up instructions, medication list, d/c activities limitations if indicated, with other d/c instructions as indicated by MD - patient able to verbalize understanding, all questions fully answered. Patient instructed to return to ED, call 911, or call MD for any changes in condition.  Patient escorted via WC, and D/C home via private auto.  Boykin Nearing, RN 04/07/2021 4:20 PM

## 2021-04-07 NOTE — Discharge Summary (Signed)
Name: Bryce [name] MRN: [id number] DOB: August 24, 1986 35 y.o. PCP: Pcp, No  Date of Admission: 03/29/2021  2:42 PM Date of Discharge: 04/07/2021 Attending Physician: Tyson Alias, *  Subjective: Patient reports he is doing well this morning. He was able to get up and walk around the room some, pain is much better controlled today. Discussed discharge today.  Discharge Diagnosis: 1. Left thigh myositis, abscess 2. Group A strep soft tissue abdominal infection 3. Normocytic anemia  Discharge Medications: Allergies as of 04/07/2021   No Known Allergies      Medication List     STOP taking these medications    HYDROcodone-acetaminophen 5-325 MG tablet Commonly known as: NORCO/VICODIN       TAKE these medications    amoxicillin-clavulanate 875-125 MG tablet Commonly known as: AUGMENTIN Take 1 tablet by mouth every 12 (twelve) hours for 14 days. Start taking on: April 08, 2021   naproxen 500 MG tablet Commonly known as: NAPROSYN Take 1 tablet (500 mg total) by mouth 2 (two) times daily with a meal for 14 days.   oxyCODONE 5 MG immediate release tablet Commonly known as: Oxy IR/ROXICODONE Take 1 tablet (5 mg total) by mouth every 4 (four) hours as needed for up to 5 days for moderate pain.               Durable Medical Equipment  (From admission, onward)           Start     Ordered   04/04/21 1722  For home use only DME Other see comment  Once       Comments: saline solution; 2x4 gauze pads; ABD pads; Barrier film  Question:  Length of Need  Answer:  6 Months   04/04/21 1722              Discharge Care Instructions  (From admission, onward)           Start     Ordered   04/07/21 0000  Change dressing (specify)       Comments: Wound care to right flank wound: Cleanse with NS, pat dry. Apply collagenase, top with saline moistened gauze (opened), top with ABD pad and secure with tape. Change daily and PRN   04/07/21 1433             Disposition and follow-up:   [name] was discharged from [facility] in Stable condition.  At the hospital follow up visit please address:  1.  Group A strep soft tissue abdominal infection: Patient to continue wound care with wound care clinic. Patient to receive 14 days of Augmentin, follow-up with infectious disease.   2.  Left thigh myositis, abscess: Biopsy taken during admission, please follow-up. Patient discharged with naproxen and 5d oxycodone for pain relief.  3.  Normocytic anemia: Received IV iron while inpatient, can re-check iron panel in 6-8 weeks.  4.  Labs / imaging needed at time of follow-up: CBC  5.  Pending labs/ test needing follow-up: pathology from left thigh  Follow-up Appointments:  Follow-up Information     [facility]              Follow up on 04/18/2021.   Why: 1030am Contact information: [address] [place] [address] [phone]        [facility] Follow up.   Why: Call for dressing supplies Contact information: [address] [place]  [place] [address] [phone]         [facility] [place] Follow up.   Specialty: General Surgery Why: As needed. Please follow up with the wound clinic. Please follow up with the clinic that placed your sutures to determine when they should be removed. Please call our office with any questions or concerns Contact information: [address] [place] [address] [phone]         [facility]. Go in 1 week(s).   Why: follow up Contact information: [address] [place] [address] [phone]        [facility] Go to.   Why: Walk in clinic open Monday-Saturday. They can dress the wounds daily. Contact information: [address] [place] [address] [phone]                  Hospital Course by problem list: 1. Group A strep soft tissue abdominal infection: Patient presented to the ED 7 days after being discharged from the hospital for septic shock 2/2 GAS bacteremia and surgical debridement of bite wounds. On arrival to ED patient reported increasing drainage and pain from the wounds. Antibiotics were initiated and wound care, surgery were consulted. Debridement of the area was offered, but patient declined and elected for enzymatic debridement. Hydrotherapy was initiated for further assistance with wounds. Infectious disease was also consulted. He continued on Unasyn throughout hospitalization and was transitioned to Augmentin at discharge. He is to continue this for 14 more days and follow up with the ID clinic. For his wounds, he has an appointment with the Wound Care Clinic on August 18th. Until this time, we have given him supplies and instructions on how to take care of this. In addition, he is able to go to [facility] daily for wound dressings. They are aware of the wounds already.  2.  Left thigh myositis and abscess: During previous admission found to have small abscess in left vastus medialis. At the time no surgical intervention was recommended and patient was to continue with antibiotics. During this admission, repeat MRI showed worsening myositis and growing abscess. IR and orthopedic surgery was subsequently consulted. On 8/5 IR performed core needle biopsy of abscess, as there was no fluid component to drain. Orthopedics signed off. Will await pathology report.  3. Normocytic anemia: Patient did require 1u pRBC during this admission. Since this time, patient's H/H have remained appropriate. Likely patient has iron deficiency with ferritin falsely elevated in setting of acute infection. He was able to receive IV iron while inpatient. Can re-check iron panel in 6-8 weeks.  Discharge Exam:   BP 107/68 (BP Location: Right Arm)   Pulse 63   Temp  97.7 F (36.5 C) (Oral)   Resp 16   Ht 5\' 5"  (1.651 m)   Wt 59.3 kg   SpO2 100%   BMI 21.75 kg/m  Discharge exam:  General: Resting comfortably in bed, no acute distress CV: Regular rate, rhythm. No murmurs, rubs, gallops Pulm: Normal work of breathing on room air. Clear to auscultation bilaterally. MSK: Normal bulk, tone. Small left thigh mass, tender to palpation. Skin: Bilateral flank wounds without surrounding erythema or purulence. Stitches on anterior abdomen in tact without surrounding erythema. Left thigh without overlying erythema or purulence from biopsy site. Neuro: Awake, alert, conversing appropriately. Psych: Normal mood, affect, speech.  Pertinent Labs, Studies, and Procedures:  CBC Latest Ref Rng & Units 04/07/2021 04/06/2021 04/05/2021  WBC 4.0 - 10.5 K/uL 9.1 11.3(H) 9.8  Hemoglobin 13.0 - 17.0 g/dL 10.3(L) 9.9(L) 9.7(L)  Hematocrit 39.0 - 52.0 % 31.0(L) 30.3(L) 29.8(L)  Platelets 150 - 400 K/uL 447(H) 462(H) 465(H)   BMP Latest Ref Rng & Units 04/06/2021 04/01/2021 03/31/2021  Glucose 70 - 99 mg/dL 259(D100(H) 638(V107(H) 92  BUN 6 - 20 mg/dL 12 9 <5(I<5(L)  Creatinine 0.61 - 1.24 mg/dL 4.330.70 2.950.81 1.880.76  Sodium 135 - 145 mmol/L 135 138 138  Potassium 3.5 - 5.1 mmol/L 3.8 4.2 3.7  Chloride 98 - 111 mmol/L 101 103 104  CO2 22 - 32 mmol/L 25 27 27   Calcium 8.9 - 10.3 mg/dL 4.1(Y8.6(L) 8.3(L) 8.1(L)   MR L femur 04/03/2021: 1. Increase in size of the distal left vastus medialis abscess, currently 7.5 cubic cm including the thick enhancing rind (previously 2.3 cm on 03/18/2021. Increase in surrounding myositis. 2. Subcutaneous edema lateral to both hips, but the more generalized subcutaneous edema along the pelvis and left thigh has essentially resolved. 3. Trace band of edema signal in the left gluteus maximus muscle could reflect low-grade inflammation or intramuscular injection site.  Discharge Instructions: Discharge Instructions     Call MD for:  difficulty breathing, headache or  visual disturbances   Complete by: As directed    Call MD for:  extreme fatigue   Complete by: As directed    Call MD for:  hives   Complete by: As directed    Call MD for:  persistant dizziness or light-headedness   Complete by: As directed    Call MD for:  persistant nausea and vomiting   Complete by: As directed    Call MD for:  redness, tenderness, or signs of infection (pain, swelling, redness, odor or green/yellow discharge around incision site)   Complete by: As directed    Call MD for:  severe uncontrolled pain   Complete by: As directed    Call MD for:  temperature >100.4   Complete by: As directed    Change dressing (specify)   Complete by: As directed    Wound care to right flank wound: Cleanse with NS, pat dry. Apply collagenase, top with saline moistened gauze (opened), top with ABD pad and secure with tape. Change daily and PRN   Diet - low sodium heart healthy   Complete by: As directed    Discharge instructions   Complete by: As directed    Sr. Lelon MastGonzlez, Estoy muy contento de que te sientas mejor hoy. Ingres debido a su infeccin y al absceso en la rodilla. Realizamos una biopsia y le informaremos de esos resultados una vez que estn de vuelta. Mientras tanto, contine viendo la clnica de cuidado de heridas como estaba antes de esta hospitalizacin. Debe continuar con antibiticos durante 14 das ms y Radio producerhacer un seguimiento con la clnica de enfermedades infecciosas.  -Antibiticos: Augmentin dos veces al da durante [employer] Companycatorce das  -Control del dolor: Oxicodona 5mg  cada 4 horas segn sea necesario + Naproxeno 500mg  dos veces al da  Fue un placer conocerlo, Sr. [place] Grove[place]onzlez. Te deseo lo mejor y espero que te mantengas feliz y saludable!  Gracias, Dr. Evlyn KannerPhillip Hjalmar Ballengee  Mr. Jayme CloudGonzalez, I am so glad you are feeling better today. You were admitted due to your infection and the abscess on your knee. We performed a biopsy and will let you know those results once they are  back. In the mean time, please continue seeing the wound care clinic as you were before this hospitalization.  You should continue with antibiotics for 14 more days and follow-up with the infectious disease clinic.  -Antibiotics: Augmentin twice daily for fourteen days  -Pain control: Oxycodone 5mg  every 4 hours as needed + Naproxen 500mg  twice daily  It was a pleasure meeting you, Mr. Camino. I wish you the best and hope you stay happy and healthy!  Thank you, , MD   Increase activity slowly   Complete by: As directed        Signed: Jayme Cloud, MD 04/07/2021, 2:34 PM   Pager: [phone]

## 2021-04-07 NOTE — Evaluation (Signed)
Physical Therapy Evaluation Patient Details Name: Bryce [name] MRN: [id number] DOB: 11/17/1985 Today's Date: 04/07/2021   History of Present Illness  Bryce [name] is a 35 year old who was previously hospitalized for a bite wound and necrotizing infection; He presented to the ED for worsening yellow drainage of wounds, increased pain and chills. Underwent Hydrotherapy while in hosptial  Clinical Impression   Patient evaluated by Physical Therapy with no further acute PT needs identified. All education has been completed and the patient has no further questions. Independent with mobility and gait;  See below for any follow-up Physical Therapy or equipment needs. PT is signing off. Thank you for this referral.  Pt has questions re: wound healing and dressing changes; referred to RN for dc education;     04/07/21 1600  Language Assistant  Interpreter Mode Video Remote Interpreter  Interpreter Name Ellie Lunch Phone Number - If applicable [phone]       Follow Up Recommendations No PT follow up    Equipment Recommendations  None recommended by PT    Recommendations for Other Services       Precautions / Restrictions Precautions Precautions: None      Mobility  Bed Mobility Overal bed mobility: Independent                  Transfers Overall transfer level: Independent Equipment used: None Transfers: Sit to/from Stand              Ambulation/Gait Ambulation/Gait assistance: Independent Educational psychologist (Feet): 550 Feet Assistive device: None;IV Pole Gait Pattern/deviations: WFL(Within Functional Limits)     General Gait Details: No increase in pain with gait. Patient ambualted independently  Information systems manager [employer] (Stroke Patients Only)       Balance Overall balance assessment: No apparent balance deficits (not formally assessed)                                            Pertinent Vitals/Pain Pain Assessment: 0-10 Pain Score: 5  Pain Location: abdomen Pain Descriptors / Indicators: Discomfort Pain Intervention(s): Monitored during session    Home Living Family/patient expects to be discharged to:: Private residence Living Arrangements: Non-relatives/Friends Available Help at Discharge: Friend(s) Type of Home: House Home Access: Stairs to enter Entrance Stairs-Rails: None Entrance Stairs-Number of Steps: 2 steps Home Layout: One level Home Equipment: None      Prior Function Level of Independence: Independent         Comments: was working full time prior to admission     Hand Dominance   Dominant Hand: Left    Extremity/Trunk Assessment   Upper Extremity Assessment Upper Extremity Assessment: Defer to OT evaluation    Lower Extremity Assessment Lower Extremity Assessment: Overall WFL for tasks assessed    Cervical / Trunk Assessment Cervical / Trunk Assessment: Other exceptions Cervical / Trunk Exceptions: noted dressings under abdominal binder  Communication   Communication: No difficulties;Other (comment) (Spanish speaking)  Cognition Arousal/Alertness: Awake/alert Behavior During Therapy: WFL for tasks assessed/performed Overall Cognitive Status: Within Functional Limits for tasks assessed  General Comments General comments (skin integrity, edema, etc.): Pt had questions related to wound healing; Answered questions, and encouraged him to ask his doctor as well    Exercises     Assessment/Plan    PT Assessment Patent does not need any further PT services  PT Problem List         PT Treatment Interventions      PT Goals (Current goals can be found in the Care Plan section)  Acute Rehab PT Goals Patient Stated Goal: Wants to know more about wound healing PT Goal Formulation: All assessment and education complete, DC therapy    Frequency     Barriers  to discharge        Co-evaluation               AM-PAC PT "6 Clicks" Mobility  Outcome Measure Help needed turning from your back to your side while in a flat bed without using bedrails?: None Help needed moving from lying on your back to sitting on the side of a flat bed without using bedrails?: None Help needed moving to and from a bed to a chair (including a wheelchair)?: None Help needed standing up from a chair using your arms (e.g., wheelchair or bedside chair)?: None Help needed to walk in hospital room?: None Help needed climbing 3-5 steps with a railing? : None 6 Click Score: 24    End of Session   Activity Tolerance: Patient tolerated treatment well Patient left: in bed;with call bell/phone within reach;with nursing/sitter in room;Other (comment) (RN preparing for dressing change teaching) Nurse Communication: Mobility status PT Visit Diagnosis: Other abnormalities of gait and mobility (R26.89);Muscle weakness (generalized) (M62.81);Difficulty in walking, not elsewhere classified (R26.2);Pain Pain - Right/Left: Right Pain - part of body:  (flank/abdomen)    Time: 0109-3235 PT Time Calculation (min) (ACUTE ONLY): 25 min   Charges:   PT Evaluation $PT Eval Low Complexity: 1 Low PT Treatments $Gait Training: 8-22 mins        Van Clines, PT  Acute Rehabilitation Services Pager [phone] Office [phone]   Levi Aland 04/07/2021, 4:06 PM

## 2021-04-08 ENCOUNTER — Encounter: Payer: Self-pay | Admitting: Internal Medicine

## 2021-04-08 LAB — SURGICAL PATHOLOGY

## 2021-04-10 ENCOUNTER — Telehealth: Payer: Self-pay

## 2021-04-10 LAB — AEROBIC/ANAEROBIC CULTURE W GRAM STAIN (SURGICAL/DEEP WOUND)
Culture: NO GROWTH
Gram Stain: NONE SEEN

## 2021-04-10 NOTE — Telephone Encounter (Signed)
-----   Message from Evlyn Kanner, MD sent at 04/07/2021 11:18 AM EDT ----- Regarding: Appointment Hello! Could we schedule a hospital follow-up visit for Mr. Risse within the next 1-2 weeks?  Thank you! Aneta Mins

## 2021-04-10 NOTE — Telephone Encounter (Signed)
TOC HFU appointment scheduled for 04/22/21 at 3:15 pm with Dr. Montez Morita and mailed to patient.

## 2021-04-17 ENCOUNTER — Other Ambulatory Visit: Payer: Self-pay

## 2021-04-17 ENCOUNTER — Encounter (HOSPITAL_BASED_OUTPATIENT_CLINIC_OR_DEPARTMENT_OTHER): Payer: Self-pay | Attending: Internal Medicine | Admitting: Internal Medicine

## 2021-04-17 DIAGNOSIS — L03311 Cellulitis of abdominal wall: Secondary | ICD-10-CM | POA: Insufficient documentation

## 2021-04-17 NOTE — Progress Notes (Signed)
Bryce [name], Bryce [name] ([id number]) Visit Report for 04/17/2021 Chief Complaint Document Details Patient Name: Date of Service: Bryce [name], Bryce [name] 04/17/2021 10:30 A M Medical Record Number: [id number] Patient Account Number: [id number] Date of Birth/Sex: Treating RN: 1985/10/23 (35 y.o. Male) Zandra Abts Primary Care Provider: PCP, NO Other Clinician: Referring Provider: Treating Provider/Extender: Susette Racer, Ripley Fraise in Treatment: 0 Information Obtained from: Patient Chief Complaint 04/17/2021; patient is here for review of wounds on his right greater than left abdominal flank Electronic Signature(s) Signed: 04/17/2021 5:36:21 PM By: Baltazar Najjar MD Entered By: Baltazar Najjar on 04/17/2021 12:35:40 -------------------------------------------------------------------------------- HPI Details Patient Name: Date of Service: Bryce [name]. 04/17/2021 10:30 A M Medical Record Number: [id number] Patient Account Number: [id number] Date of Birth/Sex: Treating RN: [birth year] (35 y.o. Male) Zandra Abts Primary Care Provider: PCP, NO Other Clinician: Referring Provider: Treating Provider/Extender: Susette Racer, Ripley Fraise in Treatment: 0 History of Present Illness HPI Description: ADMISSION 04/17/2021 This is a 35 year old man who came to clinic for review of wounds on his abdomen. History here is a bit complex and difficult to follow however it would appear that he suffered an abdominal bite injury on the mid part of his abdomen just in the area of his umbilicus sometime in July. The wound became necrotic and spreading. This was worsening with redness around the wound. He developed what was probably a group A strep cellulitis in his abdomen he had sepsis secondary to group A strep and required hospitalization from 03/13/2021 through 03/23/2021. During the hospitalization he required a surgical IandD by general surgery on 03/16/2021. Biopsies were done  of this area in the central abdomen as well as an area on his left medial thigh just above the knee area. None of these biopsies were conclusive and he currently does not have a wound on the thigh area. The patient was critically ill with hypotension lactic acidosis. He was felt to have group A sepsis secondary to abdominal cellulitis. He was seen by infectious disease. Initially treated with vancomycin and Rocephin and then tapered to Augmentin. The exact course of the areas on his bilateral flanks is a bit difficult to follow also however these were at 1 point blistered areas. Possible spreading cellulitis. At the variable points in the history he was also labeled as having DIC and I wonder whether some of this could have been small vessel ischemic necrosis in this area. Nevertheless he did have surgical IandD's of this area as well. He was readmitted on 7/29 predominantly for worsening swelling of the left thigh. He was diagnosed with left thigh myositis. As noted he did have a biopsy and was ultimately discharged on a further 2 weeks of Augmentin. A CT scan of the abdomen showed diffuse subcutaneous edema and anasarca but without identifiable abscesses. An MRI of the femur was also done. He is now back at home using wet-to-dry dressings daily with normal saline. The patient works as a Designer, fashion/clothing. He is Hispanic and we interviewed him through an interpreter. He does not have Designer, multimedia) Signed: 04/17/2021 5:36:21 PM By: Baltazar Najjar MD Entered By: Baltazar Najjar on 04/17/2021 12:41:50 -------------------------------------------------------------------------------- Physical Exam Details Patient Name: Date of Service: Bryce [name] 04/17/2021 10:30 A M Medical Record Number: [id number] Patient Account Number: [id number] Date of Birth/Sex: Treating RN: May 10, 1986 (35 y.o. Male) Zandra Abts Primary Care Provider: PCP, NO Other Clinician: Referring  Provider: Treating Provider/Extender: Susette Racer, Ripley Fraise in Treatment: 0 Constitutional Sitting  or standing Blood Pressure is within target range for patient.. Pulse regular and within target range for patient.Marland Kitchen Respirations regular, non-labored and within target range.. Temperature is normal and within the target range for the patient.Marland Kitchen Appears in no distress. Systemically well looking young man. Cardiovascular Heart rhythm and rate regular, without murmur or gallop.. Gastrointestinal (GI) His original area on the mid abdomen just below the umbilicus is healed he has 2 large areas 1 on the right greater than the left. On the right there are 4 open wound areas on the left 1 area.. Integumentary (Hair, Skin) Nothing is open on his thigh. Nontender area medially. Notes Wound exam; On the right lateral flank there are 4 wound areas. At least 3 of these have some undermining connection with the other areas with 2 bridges of skin over the top of this. Most of the granulation tissue in these wounds looks healthy no debridement was required On the left he has a single wound area fairly large and was some depth but no need for debridement here either. No evidence of infection in either wound area. Around both areas there is damaged skin over it looks like its recovery Electronic Signature(s) Signed: 04/17/2021 5:36:21 PM By: Baltazar Najjar MD Entered By: Baltazar Najjar on 04/17/2021 12:44:38 -------------------------------------------------------------------------------- Physician Orders Details Patient Name: Date of Service: Bryce [name]. 04/17/2021 10:30 A M Medical Record Number: [id number] Patient Account Number: [id number] Date of Birth/Sex: Treating RN: 03-17-1986 (35 y.o. Male) Antonieta Iba Primary Care Provider: PCP, NO Other Clinician: Referring Provider: Treating Provider/Extender: Susette Racer, Ripley Fraise in Treatment: 0 Verbal / Phone  Orders: No Diagnosis Coding Follow-up Appointments ppointment in 2 weeks. - with Dr. Leanord Hawking Return A Bathing/ Shower/ Hygiene May shower and wash wound with soap and water. Additional Orders / Instructions Follow Nutritious Diet - High Protein Diet Wound Treatment Wound #1 - Flank Wound Laterality: Right Cleanser: Soap and Water 1 x Per Day/30 Days Discharge Instructions: May shower and wash wound with dial antibacterial soap and water prior to dressing change. Secondary Dressing: Woven Gauze Sponge, Non-Sterile 4x4 in 1 x Per Day/30 Days Discharge Instructions: saline moistened gauze to wound bed Secondary Dressing: ABD Pad, 5x9 1 x Per Day/30 Days Discharge Instructions: Apply over primary dressing as directed. Secured With: Paper Tape, 2x10 (in/yd) 1 x Per Day/30 Days Discharge Instructions: Secure dressing with tape as directed. Wound #2 - Flank Wound Laterality: Left Cleanser: Soap and Water 1 x Per Day/30 Days Discharge Instructions: May shower and wash wound with dial antibacterial soap and water prior to dressing change. Secondary Dressing: Woven Gauze Sponge, Non-Sterile 4x4 in 1 x Per Day/30 Days Discharge Instructions: saline moistened gauze to wound bed Secondary Dressing: ABD Pad, 5x9 1 x Per Day/30 Days Discharge Instructions: Apply over primary dressing as directed. Secured With: Paper Tape, 2x10 (in/yd) 1 x Per Day/30 Days Discharge Instructions: Secure dressing with tape as directed. Electronic Signature(s) Signed: 04/17/2021 5:36:21 PM By: Baltazar Najjar MD Signed: 04/17/2021 6:24:39 PM By: Antonieta Iba Entered By: Antonieta Iba on 04/17/2021 12:02:07 -------------------------------------------------------------------------------- Problem List Details Patient Name: Date of Service: Bryce [name]. 04/17/2021 10:30 A M Medical Record Number: [id number] Patient Account Number: [id number] Date of Birth/Sex: Treating RN: August 11, 1986 (34 y.o. Male) Zandra Abts Primary Care Provider: PCP, NO Other Clinician: Referring Provider: Treating Provider/Extender: Susette Racer, Ripley Fraise in Treatment: 0 Active Problems ICD-10 Encounter Code Description Active Date MDM Diagnosis S31.100D Unspecified open wound of abdominal wall,  right upper quadrant without 04/17/2021 No Yes penetration into peritoneal cavity, subsequent encounter S31.101A Unspecified open wound of abdominal wall, left upper quadrant without 04/17/2021 No Yes penetration into peritoneal cavity, initial encounter A40.0 Sepsis due to streptococcus, group A 04/17/2021 No Yes L03.311 Cellulitis of abdominal wall 04/17/2021 No Yes Inactive Problems Resolved Problems Electronic Signature(s) Signed: 04/17/2021 5:36:21 PM By: Baltazar Najjar MD Entered By: Baltazar Najjar on 04/17/2021 12:34:02 -------------------------------------------------------------------------------- Progress Note Details Patient Name: Date of Service: Bryce [name]. 04/17/2021 10:30 A M Medical Record Number: [id number] Patient Account Number: [id number] Date of Birth/Sex: Treating RN: 1985-10-05 (34 y.o. Male) Zandra Abts Primary Care Provider: PCP, NO Other Clinician: Referring Provider: Treating Provider/Extender: Susette Racer, Ripley Fraise in Treatment: 0 Subjective Chief Complaint Information obtained from Patient 04/17/2021; patient is here for review of wounds on his right greater than left abdominal flank History of Present Illness (HPI) ADMISSION 04/17/2021 This is a 35 year old man who came to clinic for review of wounds on his abdomen. History here is a bit complex and difficult to follow however it would appear that he suffered an abdominal bite injury on the mid part of his abdomen just in the area of his umbilicus sometime in July. The wound became necrotic and spreading. This was worsening with redness around the wound. He developed what was probably a group  A strep cellulitis in his abdomen he had sepsis secondary to group A strep and required hospitalization from 03/13/2021 through 03/23/2021. During the hospitalization he required a surgical IandD by general surgery on 03/16/2021. Biopsies were done of this area in the central abdomen as well as an area on his left medial thigh just above the knee area. None of these biopsies were conclusive and he currently does not have a wound on the thigh area. The patient was critically ill with hypotension lactic acidosis. He was felt to have group A sepsis secondary to abdominal cellulitis. He was seen by infectious disease. Initially treated with vancomycin and Rocephin and then tapered to Augmentin. The exact course of the areas on his bilateral flanks is a bit difficult to follow also however these were at 1 point blistered areas. Possible spreading cellulitis. At the variable points in the history he was also labeled as having DIC and I wonder whether some of this could have been small vessel ischemic necrosis in this area. Nevertheless he did have surgical IandD's of this area as well. He was readmitted on 7/29 predominantly for worsening swelling of the left thigh. He was diagnosed with left thigh myositis. As noted he did have a biopsy and was ultimately discharged on a further 2 weeks of Augmentin. A CT scan of the abdomen showed diffuse subcutaneous edema and anasarca but without identifiable abscesses. An MRI of the femur was also done. He is now back at home using wet-to-dry dressings daily with normal saline. The patient works as a Designer, fashion/clothing. He is Hispanic and we interviewed him through an interpreter. He does not have medical insurance Patient History Information obtained from Patient. Allergies No Known Allergies Family History Diabetes - Mother, No family history of Cancer, Heart Disease, Hereditary Spherocytosis, Hypertension, Kidney Disease, Lung Disease, Seizures, Stroke, Thyroid  Problems, Tuberculosis. Social History Former smoker, Marital Status - Single, Alcohol Use - Rarely, Drug Use - No History, Caffeine Use - Rarely. Medical History Hematologic/Lymphatic Patient has history of Anemia Review of Systems (ROS) Eyes Denies complaints or symptoms of Dry Eyes, Vision Changes, Glasses / Contacts. Ear/Nose/Mouth/Throat Denies complaints  or symptoms of Chronic sinus problems or rhinitis. Respiratory Denies complaints or symptoms of Chronic or frequent coughs, Shortness of Breath. Cardiovascular Denies complaints or symptoms of Chest pain. Gastrointestinal Denies complaints or symptoms of Frequent diarrhea, Nausea, Vomiting. Endocrine Denies complaints or symptoms of Heat/cold intolerance. Genitourinary Denies complaints or symptoms of Frequent urination. Integumentary (Skin) Complains or has symptoms of Wounds. Musculoskeletal Denies complaints or symptoms of Muscle Pain, Muscle Weakness. Neurologic Denies complaints or symptoms of Numbness/parasthesias. Psychiatric Denies complaints or symptoms of Claustrophobia, Suicidal. Objective Constitutional Sitting or standing Blood Pressure is within target range for patient.. Pulse regular and within target range for patient.Marland Kitchen Respirations regular, non-labored and within target range.. Temperature is normal and within the target range for the patient.Marland Kitchen Appears in no distress. Systemically well looking young man. Vitals Time Taken: 11:06 AM, Height: 61 in, Source: Stated, Weight: 127 lbs, BMI: 24, Temperature: 98.1 F, Pulse: 85 bpm, Respiratory Rate: 16 breaths/min, Blood Pressure: 135/81 mmHg. Cardiovascular Heart rhythm and rate regular, without murmur or gallop.. Gastrointestinal (GI) His original area on the mid abdomen just below the umbilicus is healed he has 2 large areas 1 on the right greater than the left. On the right there are 4 open wound areas on the left 1 area.. General Notes: Wound exam; oo  On the right lateral flank there are 4 wound areas. At least 3 of these have some undermining connection with the other areas with 2 bridges of skin over the top of this. Most of the granulation tissue in these wounds looks healthy no debridement was required oo On the left he has a single wound area fairly large and was some depth but no need for debridement here either. oo No evidence of infection in either wound area. oo Around both areas there is damaged skin over it looks like its recovery Integumentary (Hair, Skin) Nothing is open on his thigh. Nontender area medially. Wound #1 status is Open. Original cause of wound was Other Lesion. The date acquired was: 03/18/2021. The wound is located on the Right Flank. The wound measures 13cm length x 9.2cm width x 0.3cm depth; 93.934cm^2 area and 28.18cm^3 volume. There is Fat Layer (Subcutaneous Tissue) exposed. There is no undermining noted, however, there is tunneling at 3:00 with a maximum distance of 1cm. There is a medium amount of serosanguineous drainage noted. The wound margin is distinct with the outline attached to the wound base. There is large (67-100%) red granulation within the wound bed. There is a small (1-33%) amount of necrotic tissue within the wound bed including Adherent Slough. General Notes: Middle wound tunnels into upper wound Wound #2 status is Open. Original cause of wound was Other Lesion. The date acquired was: 03/18/2021. The wound is located on the Left Flank. The wound measures 3cm length x 5.5cm width x 0.2cm depth; 12.959cm^2 area and 2.592cm^3 volume. There is Fat Layer (Subcutaneous Tissue) exposed. There is no tunneling or undermining noted. There is a medium amount of serosanguineous drainage noted. The wound margin is distinct with the outline attached to the wound base. There is large (67-100%) red granulation within the wound bed. There is a small (1-33%) amount of necrotic tissue within the wound bed  including Adherent Slough. Assessment Active Problems ICD-10 Unspecified open wound of abdominal wall, right upper quadrant without penetration into peritoneal cavity, subsequent encounter Unspecified open wound of abdominal wall, left upper quadrant without penetration into peritoneal cavity, initial encounter Sepsis due to streptococcus, group A Cellulitis of abdominal wall Plan  Follow-up Appointments: Return Appointment in 2 weeks. - with Dr. Leanord Hawkingobson Bathing/ Shower/ Hygiene: May shower and wash wound with soap and water. Additional Orders / Instructions: Follow Nutritious Diet - High Protein Diet WOUND #1: - Flank Wound Laterality: Right Cleanser: Soap and Water 1 x Per Day/30 Days Discharge Instructions: May shower and wash wound with dial antibacterial soap and water prior to dressing change. Secondary Dressing: Woven Gauze Sponge, Non-Sterile 4x4 in 1 x Per Day/30 Days Discharge Instructions: saline moistened gauze to wound bed Secondary Dressing: ABD Pad, 5x9 1 x Per Day/30 Days Discharge Instructions: Apply over primary dressing as directed. Secured With: Paper T ape, 2x10 (in/yd) 1 x Per Day/30 Days Discharge Instructions: Secure dressing with tape as directed. WOUND #2: - Flank Wound Laterality: Left Cleanser: Soap and Water 1 x Per Day/30 Days Discharge Instructions: May shower and wash wound with dial antibacterial soap and water prior to dressing change. Secondary Dressing: Woven Gauze Sponge, Non-Sterile 4x4 in 1 x Per Day/30 Days Discharge Instructions: saline moistened gauze to wound bed Secondary Dressing: ABD Pad, 5x9 1 x Per Day/30 Days Discharge Instructions: Apply over primary dressing as directed. Secured With: Paper Tape, 2x10 (in/yd) 1 x Per Day/30 Days Discharge Instructions: Secure dressing with tape as directed. 1. Right great and left flank wounds. This is some combination of spreading cellulitis/group A strep. Also I believe he had surgical IandD's in  these areas on 7/16. Biopsy was nonconclusive in the periumbilical area however this is closed. 2. He is listed in some of the notes I looked at as having DIC. I did not look this over in epic in any detail although if he did have the DIC I wonder about ischemic necrosis in the abdominal areas. 3. He has completed his antibiotics Augmentin I see no reason for cultures or any further imaging for that matter. No additional antibiotics were felt to be necessary 4. The patient is using wet-to-dry dressing and for now this may be the most cost effective dressing he can do. He is using normal saline and gauze and will allow him to continue to do this. He does not have insurance which will limit what we can order in terms of wound care supplies and so for now I think wet-to-dry dressings are reasonable. It is quite possible that the bridging areas on the right will need to be removed and I talked to the patient about this but for now I think things can be watched 5. The area on his left medial thigh question myositis question abscess is resolved and there is no open wound here. I did not attempt to further classify this. I realize it was biopsied as well at 1 point I spent 45 minutes in review of this patient's past medical history, face-to-face evaluation and preparation of this record Electronic Signature(s) Signed: 04/17/2021 5:36:21 PM By: Baltazar Najjarobson, Dezaray Shibuya MD Entered By: Baltazar Najjarobson, Jalessa Peyser on 04/17/2021 12:49:13 -------------------------------------------------------------------------------- HxROS Details Patient Name: Date of Service: Bryce EmeraldGO NZA [name], [name] 04/17/2021 10:30 A M Medical Record Number: [id number] Patient Account Number: [id number] Date of Birth/Sex: Treating RN: 05/19/1986 (34 y.o. Male) Antonieta IbaBarnhart, Jodi Primary Care Provider: PCP, NO Other Clinician: Referring Provider: Treating Provider/Extender: Susette Racerobson, Maciah Schweigert Vincent, Ripley Fraiseuncan Weeks in Treatment: 0 Information Obtained  From Patient Eyes Complaints and Symptoms: Negative for: Dry Eyes; Vision Changes; Glasses / Contacts Ear/Nose/Mouth/Throat Complaints and Symptoms: Negative for: Chronic sinus problems or rhinitis Respiratory Complaints and Symptoms: Negative for: Chronic or frequent coughs; Shortness of Breath Cardiovascular Complaints and  Symptoms: Negative for: Chest pain Gastrointestinal Complaints and Symptoms: Negative for: Frequent diarrhea; Nausea; Vomiting Endocrine Complaints and Symptoms: Negative for: Heat/cold intolerance Genitourinary Complaints and Symptoms: Negative for: Frequent urination Integumentary (Skin) Complaints and Symptoms: Positive for: Wounds Musculoskeletal Complaints and Symptoms: Negative for: Muscle Pain; Muscle Weakness Neurologic Complaints and Symptoms: Negative for: Numbness/parasthesias Psychiatric Complaints and Symptoms: Negative for: Claustrophobia; Suicidal Hematologic/Lymphatic Medical History: Positive for: Anemia Immunological Oncologic Immunizations Pneumococcal Vaccine: Received Pneumococcal Vaccination: No Implantable Devices None Family and Social History Cancer: No; Diabetes: Yes - Mother; Heart Disease: No; Hereditary Spherocytosis: No; Hypertension: No; Kidney Disease: No; Lung Disease: No; Seizures: No; Stroke: No; Thyroid Problems: No; Tuberculosis: No; Former smoker; Marital Status - Single; Alcohol Use: Rarely; Drug Use: No History; Caffeine Use: Rarely; Financial Concerns: No; Food, Clothing or Shelter Needs: No; Support System Lacking: No; Transportation Concerns: No Electronic Signature(s) Signed: 04/17/2021 5:36:21 PM By: Baltazar Najjar MD Signed: 04/17/2021 6:24:39 PM By: Antonieta Iba Entered By: Antonieta Iba on 04/17/2021 11:11:18 -------------------------------------------------------------------------------- SuperBill Details Patient Name: Date of Service: Bryce [name] 04/17/2021 Medical Record  Number: [id number] Patient Account Number: [id number] Date of Birth/Sex: Treating RN: Nov 13, 1985 (34 y.o. Male) Zandra Abts Primary Care Provider: PCP, NO Other Clinician: Referring Provider: Treating Provider/Extender: Susette Racer, Ripley Fraise in Treatment: 0 Diagnosis Coding ICD-10 Codes Code Description S31.100D Unspecified open wound of abdominal wall, right upper quadrant without penetration into peritoneal cavity, subsequent encounter S31.101A Unspecified open wound of abdominal wall, left upper quadrant without penetration into peritoneal cavity, initial encounter A40.0 Sepsis due to streptococcus, group A L03.311 Cellulitis of abdominal wall Facility Procedures CPT4 Code: [id number] Description: 99214 - WOUND CARE VISIT-LEV 4 EST PT Modifier: 25 Quantity: 1 Physician Procedures : CPT4 Code Description Modifier [id number] - WC PHYS LEVEL 4 - NEW PT ICD-10 Diagnosis Description S31.100D Unspecified open wound of abdominal wall, right upper quadrant without penetration into peritoneal cavity subsequent encounter S31.101A  Unspecified open wound of abdominal wall, left upper quadrant without penetration into peritoneal cavity, encounter A40.0 Sepsis due to streptococcus, group A L03.311 Cellulitis of abdominal wall Quantity: 1 , initial Electronic Signature(s) Signed: 04/17/2021 5:36:21 PM By: Baltazar Najjar MD Signed: 04/17/2021 6:24:39 PM By: Antonieta Iba Entered By: Antonieta Iba on 04/17/2021 13:36:50

## 2021-04-17 NOTE — Progress Notes (Signed)
BONHAM, ZINGALE ([id number]) Visit Report for 04/17/2021 Abuse/Suicide Risk Screen Details Patient Name: Date of Service: Bryce [name], Bryce [name] 04/17/2021 10:30 A M Medical Record Number: [id number] Patient Account Number: [id number] Date of Birth/Sex: Treating RN: 28-Jul-1986 (34 y.o. Male) Antonieta Iba Primary Care Delmus Warwick: PCP, NO Other Clinician: Referring Delrico Minehart: Treating Cartier Mapel/Extender: Susette Racer, Ripley Fraise in Treatment: 0 Abuse/Suicide Risk Screen Items Answer ABUSE RISK SCREEN: Has anyone close to you tried to hurt or harm you recentlyo No Do you feel uncomfortable with anyone in your familyo No Has anyone forced you do things that you didnt want to doo No Electronic Signature(s) Signed: 04/17/2021 6:24:39 PM By: Antonieta Iba Entered By: Antonieta Iba on 04/17/2021 11:11:32 -------------------------------------------------------------------------------- Activities of Daily Living Details Patient Name: Date of Service: Bryce [name], Bryce [name] 04/17/2021 10:30 A M Medical Record Number: [id number] Patient Account Number: [id number] Date of Birth/Sex: Treating RN: 01/19/1986 (34 y.o. Male) Antonieta Iba Primary Care Ronie Fleeger: PCP, NO Other Clinician: Referring Ezella Kell: Treating Arriyanna Mersch/Extender: Susette Racer, Ripley Fraise in Treatment: 0 Activities of Daily Living Items Answer Activities of Daily Living (Please select one for each item) Drive Automobile Not Able T Medications ake Completely Able Use T elephone Completely Able Care for Appearance Completely Able Use T oilet Completely Able Bath / Shower Completely Able Dress Self Completely Able Feed Self Completely Able Walk Completely Able Get In / Out Bed Completely Able Housework Completely Able Prepare Meals Completely Able Handle Money Completely Able Shop for Self Completely Able Electronic Signature(s) Signed: 04/17/2021 6:24:39 PM By: Antonieta Iba Entered By:  Antonieta Iba on 04/17/2021 11:12:03 -------------------------------------------------------------------------------- Education Screening Details Patient Name: Date of Service: Bryce Emerald. 04/17/2021 10:30 A M Medical Record Number: [id number] Patient Account Number: [id number] Date of Birth/Sex: Treating RN: 1986/04/05 (34 y.o. Male) Antonieta Iba Primary Care Carman Essick: PCP, NO Other Clinician: Referring Malaysha Arlen: Treating Minami Arriaga/Extender: Susette Racer, Ripley Fraise in Treatment: 0 Primary Learner Assessed: Patient Learning Preferences/Education Level/Primary Language Preferred Language: Spanish; Castilian Cognitive Barrier Language Barrier: Yes Interpreter, Spanish Translator Needed: Yes Hospital Employed Language Interpreter Memory Deficit: No Emotional Barrier: No Cultural/Religious Beliefs Affecting Medical Care: No Physical Barrier Impaired Vision: No Impaired Hearing: No Decreased Hand dexterity: No Knowledge/Comprehension Knowledge Level: Medium Comprehension Level: Medium Ability to understand written instructions: Medium Ability to understand verbal instructions: Medium Motivation Anxiety Level: Calm Cooperation: Cooperative Education Importance: Acknowledges Need Interest in Health Problems: Asks Questions Perception: Coherent Willingness to Engage in Self-Management Medium Activities: Readiness to Engage in Self-Management Medium Activities: Electronic Signature(s) Signed: 04/17/2021 6:24:39 PM By: Antonieta Iba Entered By: Antonieta Iba on 04/17/2021 11:12:43 -------------------------------------------------------------------------------- Fall Risk Assessment Details Patient Name: Date of Service: Bryce Emerald. 04/17/2021 10:30 A M Medical Record Number: [id number] Patient Account Number: [id number] Date of Birth/Sex: Treating RN: April 01, 1986 (34 y.o. Male) Antonieta Iba Primary Care Shanya Ferriss: PCP, NO Other  Clinician: Referring Derinda Bartus: Treating Giavana Rooke/Extender: Susette Racer, Ripley Fraise in Treatment: 0 Fall Risk Assessment Items Have you had 2 or more falls in the last 12 monthso 0 No Have you had any fall that resulted in injury in the last 12 monthso 0 No FALLS RISK SCREEN History of falling - immediate or within 3 months 0 No Secondary diagnosis (Do you have 2 or more medical diagnoseso) 0 No Ambulatory aid None/bed rest/wheelchair/nurse 0 Yes Crutches/cane/walker 0 No Furniture 0 No Intravenous therapy Access/Saline/Heparin Lock 0 No Gait/Transferring Normal/ bed rest/ wheelchair 0 Yes Weak (short steps with or without  shuffle, stooped but able to lift head while walking, may seek 0 No support from furniture) Impaired (short steps with shuffle, may have difficulty arising from chair, head down, impaired 0 No balance) Mental Status Oriented to own ability 0 Yes Electronic Signature(s) Signed: 04/17/2021 6:24:39 PM By: Antonieta Iba Entered By: Antonieta Iba on 04/17/2021 11:12:53 -------------------------------------------------------------------------------- Foot Assessment Details Patient Name: Date of Service: Bryce Emerald. 04/17/2021 10:30 A M Medical Record Number: [id number] Patient Account Number: [id number] Date of Birth/Sex: Treating RN: Jun 28, 1986 (34 y.o. Male) Antonieta Iba Primary Care Charell Faulk: PCP, NO Other Clinician: Referring Bralynn Donado: Treating Chevelle Coulson/Extender: Susette Racer, Ripley Fraise in Treatment: 0 Foot Assessment Items Site Locations + = Sensation present, - = Sensation absent, C = Callus, U = Ulcer R = Redness, W = Warmth, M = Maceration, PU = Pre-ulcerative lesion F = Fissure, S = Swelling, D = Dryness Assessment Right: Left: Other Deformity: No No Prior Foot Ulcer: No No Prior Amputation: No No Charcot Joint: No No Ambulatory Status: Ambulatory Without Help Gait: Steady Notes N/A: Flank  wounds Electronic Signature(s) Signed: 04/17/2021 6:24:39 PM By: Antonieta Iba Entered By: Antonieta Iba on 04/17/2021 11:35:07 -------------------------------------------------------------------------------- Nutrition Risk Screening Details Patient Name: Date of Service: Bryce [name], Bryce [name] 04/17/2021 10:30 A M Medical Record Number: [id number] Patient Account Number: [id number] Date of Birth/Sex: Treating RN: Jan 28, 1986 (34 y.o. Male) Antonieta Iba Primary Care Marnell Mcdaniel: PCP, NO Other Clinician: Referring Aniza Shor: Treating Cristy Colmenares/Extender: Susette Racer, Ripley Fraise in Treatment: 0 Height (in): 61 Weight (lbs): 127 Body Mass Index (BMI): 24 Nutrition Risk Screening Items Score Screening NUTRITION RISK SCREEN: I have an illness or condition that made me change the kind and/or amount of food I eat 0 No I eat fewer than two meals per day 0 No I eat few fruits and vegetables, or milk products 0 No I have three or more drinks of beer, liquor or wine almost every day 0 No I have tooth or mouth problems that make it hard for me to eat 0 No I don't always have enough money to buy the food I need 0 No I eat alone most of the time 0 No I take three or more different prescribed or over-the-counter drugs a day 0 No Without wanting to, I have lost or gained 10 pounds in the last six months 0 No I am not always physically able to shop, cook and/or feed myself 0 No Nutrition Protocols Good Risk Protocol 0 No interventions needed Moderate Risk Protocol High Risk Proctocol Risk Level: Good Risk Score: 0 Electronic Signature(s) Signed: 04/17/2021 6:24:39 PM By: Antonieta Iba Entered By: Antonieta Iba on 04/17/2021 11:12:59

## 2021-04-17 NOTE — Progress Notes (Signed)
[name], Darlene Q. ([id number]) Visit Report for 04/17/2021 Allergy List Details Patient Name: Date of Service: GO Bryce BarnacleZA [name], [name] 04/17/2021 10:30 A M Medical Record Number: [id number] Patient Account Number: [id number] Date of Birth/Sex: Treating RN: [birth year] (34 y.o. Male) Antonieta IbaBarnhart, Jodi Primary Care Shakeira Rhee: PCP, NO Other Clinician: Referring Trinia Georgi: Treating Kahli Fitzgerald/Extender: Susette Racerobson, Michael Vincent, Para Marchuncan Weeks in Treatment: 0 Allergies Active Allergies No Known Allergies Allergy Notes Electronic Signature(s) Signed: 04/17/2021 6:24:39 PM By: Antonieta IbaBarnhart, Jodi Entered By: Antonieta IbaBarnhart, Jodi on 04/17/2021 11:07:44 -------------------------------------------------------------------------------- Arrival Information Details Patient Name: Date of Service: Bryce EmeraldGO NZA [name], [name] 04/17/2021 10:30 A M Medical Record Number: [id number] Patient Account Number: [id number] Date of Birth/Sex: Treating RN: [birth year] (34 y.o. Male) Antonieta IbaBarnhart, Jodi Primary Care Devante Capano: PCP, NO Other Clinician: Referring Ingris Pasquarella: Treating Manpreet Kemmer/Extender: Susette Racerobson, Michael Vincent, Ripley Fraiseuncan Weeks in Treatment: 0 Visit Information Patient Arrived: Ambulatory Arrival Time: 11:05 Accompanied By: interpreter Transfer Assistance: None Patient Identification Verified: Yes Secondary Verification Process Completed: Yes Patient Requires Transmission-Based Precautions: No Patient Has Alerts: No Electronic Signature(s) Signed: 04/17/2021 6:24:39 PM By: Antonieta IbaBarnhart, Jodi Entered By: Antonieta IbaBarnhart, Jodi on 04/17/2021 11:06:22 -------------------------------------------------------------------------------- Clinic Level of Care Assessment Details Patient Name: Date of Service: GO Bryce BarnacleZA [name], [name] 04/17/2021 10:30 A M Medical Record Number: [id number] Patient Account Number: [id number] Date of Birth/Sex: Treating RN: [birth year] (34 y.o. Male) Antonieta IbaBarnhart, Jodi Primary Care Khaniya Tenaglia: PCP, NO Other Clinician: Referring  Donne Baley: Treating Sheera Illingworth/Extender: Susette Racerobson, Michael Vincent, Ripley Fraiseuncan Weeks in Treatment: 0 Clinic Level of Care Assessment Items TOOL 2 Quantity Score X- 1 0 Use when only an EandM is performed on the INITIAL visit ASSESSMENTS - Nursing Assessment / Reassessment X- 1 20 General Physical Exam (combine w/ comprehensive assessment (listed just below) when performed on new pt. evals) X- 1 25 Comprehensive Assessment (HX, ROS, Risk Assessments, Wounds Hx, etc.) ASSESSMENTS - Wound and Skin A ssessment / Reassessment []  - 0 Simple Wound Assessment / Reassessment - one wound X- 2 5 Complex Wound Assessment / Reassessment - multiple wounds []  - 0 Dermatologic / Skin Assessment (not related to wound area) ASSESSMENTS - Ostomy and/or Continence Assessment and Care []  - 0 Incontinence Assessment and Management []  - 0 Ostomy Care Assessment and Management (repouching, etc.) PROCESS - Coordination of Care []  - 0 Simple Patient / Family Education for ongoing care X- 1 20 Complex (extensive) Patient / Family Education for ongoing care X- 1 10 Staff obtains ChiropractorConsents, Records, T Results / Process Orders est []  - 0 Staff telephones HHA, Nursing Homes / Clarify orders / etc []  - 0 Routine Transfer to another Facility (non-emergent condition) []  - 0 Routine Hospital Admission (non-emergent condition) []  - 0 New Admissions / Manufacturing engineernsurance Authorizations / Ordering NPWT Apligraf, etc. , []  - 0 Emergency Hospital Admission (emergent condition) []  - 0 Simple Discharge Coordination []  - 0 Complex (extensive) Discharge Coordination PROCESS - Special Needs []  - 0 Pediatric / Minor Patient Management []  - 0 Isolation Patient Management X- 1 15 Hearing / Language / Visual special needs []  - 0 Assessment of Community assistance (transportation, D/C planning, etc.) []  - 0 Additional assistance / Altered mentation []  - 0 Support Surface(s) Assessment (bed, cushion, seat, etc.) INTERVENTIONS  - Wound Cleansing / Measurement X- 1 5 Wound Imaging (photographs - any number of wounds) []  - 0 Wound Tracing (instead of photographs) []  - 0 Simple Wound Measurement - one wound X- 2 5 Complex Wound Measurement - multiple wounds []  - 0 Simple Wound Cleansing - one wound X- 2 5  Complex Wound Cleansing - multiple wounds INTERVENTIONS - Wound Dressings X - Small Wound Dressing one or multiple wounds 1 10 X- 1 15 Medium Wound Dressing one or multiple wounds []  - 0 Large Wound Dressing one or multiple wounds []  - 0 Application of Medications - injection INTERVENTIONS - Miscellaneous []  - 0 External ear exam []  - 0 Specimen Collection (cultures, biopsies, blood, body fluids, etc.) []  - 0 Specimen(s) / Culture(s) sent or taken to Lab for analysis []  - 0 Patient Transfer (multiple staff / Lift / Similar devices) []  - 0 Simple Staple / Suture removal (25 or less) []  - 0 Complex Staple / Suture removal (26 or more) []  - 0 Hypo / Hyperglycemic Management (close monitor of Blood Glucose) []  - 0 Ankle / Brachial Index (ABI) - do not check if billed separately Has the patient been seen at the hospital within the last three years: Yes Total Score: 150 Level Of Care: New/Established - Level 4 Electronic Signature(s) Signed: 04/17/2021 6:24:39 PM By: Entered By: on 04/17/2021 12:03:31 -------------------------------------------------------------------------------- Encounter Discharge Information Details Patient Name: Date of Service: . 04/17/2021 10:30 A M Medical Record Number: Patient Account Number: Date of Birth/Sex: Treating RN: Apr 18, 1986 (34 y.o. Male) 04/19/2021 Primary Care Mirtie Bastyr: PCP, NO Other Clinician: Referring Colyn Miron: Treating Chamille Werntz/Extender: Antonieta Iba, Antonieta Iba in Treatment: 0 Encounter Discharge Information Items Discharge Condition: Stable Ambulatory  Status: Ambulatory Discharge Destination: Home Transportation: Private Auto Accompanied By: Interpreter Schedule Follow-up Appointment: Yes Clinical Summary of Care: Provided on 04/17/2021 Form Type Recipient Paper Patient Patient Electronic Signature(s) Signed: 04/17/2021 5:44:14 PM By: 04/19/2021 Entered By: [id number] on 04/17/2021 17:44:14 -------------------------------------------------------------------------------- Lower Extremity Assessment Details Patient Name: Date of Service: GO Bryce [name], Bryce [name] 04/17/2021 10:30 A M Medical Record Number: Antonieta Iba Patient Account Number: Susette Racer Date of Birth/Sex: Treating RN: 17-May-1986 (34 y.o. Male) 04/19/2021 Primary Care Linda Biehn: PCP, NO Other Clinician: Referring Maliek Schellhorn: Treating Marisah Laker/Extender: Antonieta Iba, Antonieta Iba in Treatment: 0 Electronic Signature(s) Signed: 04/17/2021 6:24:39 PM By: Bryce Barnacle Entered By: 04/19/2021 on 04/17/2021 11:35:14 -------------------------------------------------------------------------------- Multi Wound Chart Details Patient Name: Date of Service: [id number]. 04/17/2021 10:30 A M Medical Record Number: [id number] Patient Account Number: Antonieta Iba Date of Birth/Sex: Treating RN: Aug 28, 1986 (34 y.o. Male) 04/19/2021 Primary Care Darriana Deboy: PCP, NO Other Clinician: Referring Amala Petion: Treating Jelina Paulsen/Extender: Antonieta Iba, Antonieta Iba in Treatment: 0 Vital Signs Height(in): 61 Pulse(bpm): 85 Weight(lbs): 127 Blood Pressure(mmHg): 135/81 Body Mass Index(BMI): 24 Temperature(F): 98.1 Respiratory Rate(breaths/min): 16 Photos: [N/A:N/A] Right Flank Left Flank N/A Wound Location: Other Lesion Other Lesion N/A Wounding Event: Infection - not elsewhere classified Infection - not elsewhere classified N/A Primary Etiology: Anemia Anemia N/A Comorbid History: 03/18/2021 03/18/2021 N/A Date Acquired: 0 0 N/A Weeks  of Treatment: Open Open N/A Wound Status: Yes No N/A Clustered Wound: 13x9.2x0.3 3x5.5x0.2 N/A Measurements L x W x D (cm) 93.934 12.959 N/A A (cm) : rea 28.18 2.592 N/A Volume (cm) : 0.00% 0.00% N/A % Reduction in A rea: 0.00% 0.00% N/A % Reduction in Volume: 3 Position 1 (o'clock): 1 Maximum Distance 1 (cm): Yes No N/A Tunneling: Full Thickness Without Exposed Full Thickness Without Exposed N/A Classification: Support Structures Support Structures Medium Medium N/A Exudate Amount: Serosanguineous Serosanguineous N/A Exudate Type: red, brown red, brown N/A Exudate Color: Distinct, outline attached Distinct, outline attached N/A Wound Margin: Large (67-100%) Large (67-100%) N/A Granulation Amount: Red Red  N/A Granulation Quality: Small (1-33%) Small (1-33%) N/A Necrotic Amount: Fat Layer (Subcutaneous Tissue): Yes Fat Layer (Subcutaneous Tissue): Yes N/A Exposed Structures: Fascia: No Fascia: No Tendon: No Tendon: No Muscle: No Muscle: No Joint: No Joint: No Bone: No Bone: No N/A None N/A Epithelialization: Middle wound tunnels into upper wound N/A N/A Assessment Notes: Treatment Notes Electronic Signature(s) Signed: 04/17/2021 5:36:21 PM By: Baltazar Najjar MD Signed: 04/17/2021 6:11:36 PM By: Zandra Abts RN, BSN Entered By: Baltazar Najjar on 04/17/2021 12:34:12 -------------------------------------------------------------------------------- Multi-Disciplinary Care Plan Details Patient Name: Date of Service: Bryce Emerald. 04/17/2021 10:30 A M Medical Record Number: [id number] Patient Account Number: [id number] Date of Birth/Sex: Treating RN: Oct 06, 1985 (34 y.o. Male) Antonieta Iba Primary Care Deshanae Lindo: PCP, NO Other Clinician: Referring Crystol Walpole: Treating Shlonda Dolloff/Extender: Susette Racer, Ripley Fraise in Treatment: 0 Active Inactive Orientation to the Wound Care Program Nursing Diagnoses: Knowledge deficit related to  the wound healing center program Goals: Patient/caregiver will verbalize understanding of the Wound Healing Center Program Date Initiated: 04/17/2021 Target Resolution Date: 05/15/2021 Goal Status: Active Interventions: Provide education on orientation to the wound center Notes: Wound/Skin Impairment Nursing Diagnoses: Impaired tissue integrity Goals: Patient/caregiver will verbalize understanding of skin care regimen Date Initiated: 04/17/2021 Target Resolution Date: 05/15/2021 Goal Status: Active Ulcer/skin breakdown will have a volume reduction of 30% by week 4 Date Initiated: 04/17/2021 Target Resolution Date: 05/15/2021 Goal Status: Active Interventions: Assess patient/caregiver ability to obtain necessary supplies Assess patient/caregiver ability to perform ulcer/skin care regimen upon admission and as needed Assess ulceration(s) every visit Provide education on ulcer and skin care Treatment Activities: Topical wound management initiated : 04/17/2021 Notes: Electronic Signature(s) Signed: 04/17/2021 6:24:39 PM By: Antonieta Iba Entered By: Antonieta Iba on 04/17/2021 11:37:19 -------------------------------------------------------------------------------- Pain Assessment Details Patient Name: Date of Service: GO Bryce [name], Bryce [name] 04/17/2021 10:30 A M Medical Record Number: [id number] Patient Account Number: [id number] Date of Birth/Sex: Treating RN: 1986/08/18 (34 y.o. Male) Antonieta Iba Primary Care Luigi Stuckey: PCP, NO Other Clinician: Referring Strider Vallance: Treating Xoie Kreuser/Extender: Susette Racer, Ripley Fraise in Treatment: 0 Active Problems Location of Pain Severity and Description of Pain Patient Has Paino Yes Site Locations With Dressing Change: Yes Duration of the Pain. Constant / Intermittento Intermittent Rate the pain. Current Pain Level: 2 Character of Pain Describe the Pain: Aching, Tender Pain Management and Medication Current Pain  Management: Medication: Yes Cold Application: No Rest: Yes Massage: No Activity: No T.E.N.S.: No Heat Application: No Leg drop or elevation: No Is the Current Pain Management Adequate: Inadequate How does your wound impact your activities of daily livingo Sleep: No Bathing: No Appetite: No Relationship With Others: No Bladder Continence: No Emotions: No Bowel Continence: No Work: No Toileting: No Drive: No Dressing: No Hobbies: No Electronic Signature(s) Signed: 04/17/2021 6:24:39 PM By: Antonieta Iba Entered By: Antonieta Iba on 04/17/2021 11:35:44 -------------------------------------------------------------------------------- Patient/Caregiver Education Details Patient Name: Date of Service: GO Bryce Barnacle 8/17/2022andnbsp10:30 A M Medical Record Number: [id number] Patient Account Number: [id number] Date of Birth/Gender: Treating RN: [birth year] (34 y.o. Male) Antonieta Iba Primary Care Physician: PCP, NO Other Clinician: Referring Physician: Treating Physician/Extender: Roderic Ovens in Treatment: 0 Education Assessment Education Provided To: Patient Education Topics Provided Nutrition: Methods: Explain/Verbal, Printed Responses: State content correctly Welcome [facility]: o Handouts: Welcome T [facility] Spanish o Methods: Explain/Verbal, Printed Responses: State content correctly Wound/Skin Impairment: Methods: Explain/Verbal, Printed Responses: State content correctly Electronic Signature(s) Signed: 04/17/2021 6:24:39 PM By: Antonieta Iba  Entered By: Antonieta Iba on 04/17/2021 11:37:47 -------------------------------------------------------------------------------- Wound Assessment Details Patient Name: Date of Service: GO Bryce [name], Bryce [name] 04/17/2021 10:30 A M Medical Record Number: [id number] Patient Account Number: [id number] Date of Birth/Sex: Treating RN: [birth year] (34 y.o. Male)  Antonieta Iba Primary Care Etter Royall: PCP, NO Other Clinician: Referring Mohammad Granade: Treating Nyari Olsson/Extender: Susette Racer, Ripley Fraise in Treatment: 0 Wound Status Wound Number: 1 Primary Etiology: Infection - not elsewhere classified Wound Location: Right Flank Wound Status: Open Wounding Event: Other Lesion Comorbid History: Anemia Date Acquired: 03/18/2021 Weeks Of Treatment: 0 Clustered Wound: Yes Photos Wound Measurements Length: (cm) 13 Width: (cm) 9.2 Depth: (cm) 0.3 Area: (cm) 93.934 Volume: (cm) 28.18 % Reduction in Area: 0% % Reduction in Volume: 0% Tunneling: Yes Position (o'clock): 3 Maximum Distance: (cm) 1 Undermining: No Wound Description Classification: Full Thickness Without Exposed Support Structures Wound Margin: Distinct, outline attached Exudate Amount: Medium Exudate Type: Serosanguineous Exudate Color: red, brown Foul Odor After Cleansing: No Slough/Fibrino Yes Wound Bed Granulation Amount: Large (67-100%) Exposed Structure Granulation Quality: Red Fascia Exposed: No Necrotic Amount: Small (1-33%) Fat Layer (Subcutaneous Tissue) Exposed: Yes Necrotic Quality: Adherent Slough Tendon Exposed: No Muscle Exposed: No Joint Exposed: No Bone Exposed: No Assessment Notes Middle wound tunnels into upper wound Treatment Notes Wound #1 (Flank) Wound Laterality: Right Cleanser Soap and Water Discharge Instruction: May shower and wash wound with dial antibacterial soap and water prior to dressing change. Peri-Wound Care Topical Primary Dressing Secondary Dressing Woven Gauze Sponge, Non-Sterile 4x4 in Discharge Instruction: saline moistened gauze to wound bed ABD Pad, 5x9 Discharge Instruction: Apply over primary dressing as directed. Secured With Paper Tape, 2x10 (in/yd) Discharge Instruction: Secure dressing with tape as directed. Compression Wrap Compression Stockings Add-Ons Electronic Signature(s) Signed: 04/17/2021  6:24:39 PM By: Antonieta Iba Entered By: Antonieta Iba on 04/17/2021 11:55:49 -------------------------------------------------------------------------------- Wound Assessment Details Patient Name: Date of Service: Bryce Emerald 04/17/2021 10:30 A M Medical Record Number: [id number] Patient Account Number: [id number] Date of Birth/Sex: Treating RN: 1986-05-06 (34 y.o. Male) Antonieta Iba Primary Care Amena Dockham: PCP, NO Other Clinician: Referring Leidi Astle: Treating Priscille Shadduck/Extender: Susette Racer, Ripley Fraise in Treatment: 0 Wound Status Wound Number: 2 Primary Etiology: Infection - not elsewhere classified Wound Location: Left Flank Wound Status: Open Wounding Event: Other Lesion Comorbid History: Anemia Date Acquired: 03/18/2021 Weeks Of Treatment: 0 Clustered Wound: No Photos Wound Measurements Length: (cm) 3 Width: (cm) 5.5 Depth: (cm) 0.2 Area: (cm) 12.959 Volume: (cm) 2.592 % Reduction in Area: 0% % Reduction in Volume: 0% Epithelialization: None Tunneling: No Undermining: No Wound Description Classification: Full Thickness Without Exposed Support Struct Wound Margin: Distinct, outline attached Exudate Amount: Medium Exudate Type: Serosanguineous Exudate Color: red, brown ures Foul Odor After Cleansing: No Slough/Fibrino Yes Wound Bed Granulation Amount: Large (67-100%) Exposed Structure Granulation Quality: Red Fascia Exposed: No Necrotic Amount: Small (1-33%) Fat Layer (Subcutaneous Tissue) Exposed: Yes Necrotic Quality: Adherent Slough Tendon Exposed: No Muscle Exposed: No Joint Exposed: No Bone Exposed: No Treatment Notes Wound #2 (Flank) Wound Laterality: Left Cleanser Soap and Water Discharge Instruction: May shower and wash wound with dial antibacterial soap and water prior to dressing change. Peri-Wound Care Topical Primary Dressing Secondary Dressing Woven Gauze Sponge, Non-Sterile 4x4 in Discharge Instruction:  saline moistened gauze to wound bed ABD Pad, 5x9 Discharge Instruction: Apply over primary dressing as directed. Secured With Paper Tape, 2x10 (in/yd) Discharge Instruction: Secure dressing with tape as directed. Compression Wrap Compression Stockings Add-Ons Electronic Signature(s) Signed: 04/17/2021 6:24:39 PM  By: Antonieta Iba Entered By: Antonieta Iba on 04/17/2021 11:32:11 -------------------------------------------------------------------------------- Vitals Details Patient Name: Date of Service: Bryce Emerald. 04/17/2021 10:30 A M Medical Record Number: [id number] Patient Account Number: [id number] Date of Birth/Sex: Treating RN: 07-May-1986 (34 y.o. Male) Antonieta Iba Primary Care Javonn Gauger: PCP, NO Other Clinician: Referring Alinah Sheard: Treating Gunnard Dorrance/Extender: Susette Racer, Ripley Fraise in Treatment: 0 Vital Signs Time Taken: 11:06 Temperature (F): 98.1 Height (in): 61 Pulse (bpm): 85 Source: Stated Respiratory Rate (breaths/min): 16 Weight (lbs): 127 Blood Pressure (mmHg): 135/81 Body Mass Index (BMI): 24 Reference Range: 80 - 120 mg / dl Electronic Signature(s) Signed: 04/17/2021 6:24:39 PM By: Antonieta Iba Entered By: Antonieta Iba on 04/17/2021 11:07:10

## 2021-04-22 ENCOUNTER — Ambulatory Visit: Payer: Self-pay | Admitting: Student

## 2021-04-22 ENCOUNTER — Other Ambulatory Visit: Payer: Self-pay

## 2021-04-22 ENCOUNTER — Encounter: Payer: Self-pay | Admitting: Student

## 2021-04-22 DIAGNOSIS — Z0000 Encounter for general adult medical examination without abnormal findings: Secondary | ICD-10-CM

## 2021-04-22 DIAGNOSIS — L02416 Cutaneous abscess of left lower limb: Secondary | ICD-10-CM

## 2021-04-22 DIAGNOSIS — S31109A Unspecified open wound of abdominal wall, unspecified quadrant without penetration into peritoneal cavity, initial encounter: Secondary | ICD-10-CM

## 2021-04-23 DIAGNOSIS — Z0000 Encounter for general adult medical examination without abnormal findings: Secondary | ICD-10-CM | POA: Insufficient documentation

## 2021-04-23 NOTE — Assessment & Plan Note (Addendum)
-   Patient continued on additional 2 week amoxicillin course which ended  8/22 - Need to f/u w/ surgery regarding abdominal wound sutures  - Will continue to follow with wound care

## 2021-04-23 NOTE — Assessment & Plan Note (Signed)
Returned to the hospital 7/29 due to increasing pain and drainage to left leg and was readmitted for concern of worsening infection of left leg.he has been receiving hydrotherapy on his wounds. Underwent left thigh biopsy to send for cx vs. Path. Left thigh biopsy sent for culture which was negative and pathology was notable for inflammation. -Will continue to monitor

## 2021-04-23 NOTE — Progress Notes (Signed)
   CC: F/u after recent hospitalization and to establish care with PCP  HPI:  [name] is a 35 y.o. M with no known PMH.  Please see assessment and plan under notes tab for further details.    Past Medical History:  Diagnosis Date   Dyspnea    Known health problems: none 03/14/2021   PSH:  Excisional debridement 03/2021  FH:  No known CVD or cancers  SH:  Previously worked as a Designer, fashion/clothing  No drugs, no tobacco use, no current alcohol use   Allergies: NKDA   Review of Systems:  Please see assessment and plan under notes tab for further details.    Physical Exam:  Vitals:   04/22/21 1536  BP: 111/75  Pulse: (!) 104  Temp: 98.3 F (36.8 C)  TempSrc: Oral  SpO2: 100%  Weight: 122 lb 8 oz (55.6 kg)  Height: 5' 1.5" (1.562 m)   Gen: NAD CV: RRR, no murmurs Pulm: Non labored breathing on RA, no wheezing or crackles  Abd: Soft, NT, ND, R flank with well healing wound see media tab for pictures, L flank with small well healing wound  Ext: Lthigh no TTP, no edema of BLE   Assessment & Plan:   See Encounters Tab for problem based charting.  Patient discussed with Dr. Mayford Knife

## 2021-04-23 NOTE — Assessment & Plan Note (Signed)
Patient applying for orange card. Needs COVID immunizations. No further screening tests needed at this time.

## 2021-04-30 NOTE — Progress Notes (Signed)
Internal Medicine Clinic Attending  Case discussed with Dr. Carter  At the time of the visit.  We reviewed the resident's history and exam and pertinent patient test results.  I agree with the assessment, diagnosis, and plan of care documented in the resident's note.  

## 2021-05-01 ENCOUNTER — Encounter (HOSPITAL_BASED_OUTPATIENT_CLINIC_OR_DEPARTMENT_OTHER): Payer: Self-pay | Admitting: Internal Medicine

## 2021-05-01 ENCOUNTER — Other Ambulatory Visit: Payer: Self-pay

## 2021-05-02 NOTE — Progress Notes (Signed)
Bryce [name] ([id number]) Visit Report for 05/01/2021 HPI Details Patient Name: Date of Service: Bryce [name] 05/01/2021 10:00 A M Medical Record Number: [id number] Patient Account Number: [id number] Date of Birth/Sex: Treating RN: 06/27/1986 (36 y.o. Bryce [name] Primary Care Provider: PCP, NO Other Clinician: Referring Provider: Treating Provider/Extender: Susette Racer, Ripley Fraise in Treatment: 2 History of Present Illness HPI Description: ADMISSION 04/17/2021 This is a 35 year old man who came to clinic for review of wounds on his abdomen. History here is a bit complex and difficult to follow however it would appear that he suffered an abdominal bite injury on the mid part of his abdomen just in the area of his umbilicus sometime in July. The wound became necrotic and spreading. This was worsening with redness around the wound. He developed what was probably a group A strep cellulitis in his abdomen he had sepsis secondary to group A strep and required hospitalization from 03/13/2021 through 03/23/2021. During the hospitalization he required a surgical IandD by general surgery on 03/16/2021. Biopsies were done of this area in the central abdomen as well as an area on his left medial thigh just above the knee area. None of these biopsies were conclusive and he currently does not have a wound on the thigh area. The patient was critically ill with hypotension lactic acidosis. He was felt to have group A sepsis secondary to abdominal cellulitis. He was seen by infectious disease. Initially treated with vancomycin and Rocephin and then tapered to Augmentin. The exact course of the areas on his bilateral flanks is a bit difficult to follow also however these were at 1 point blistered areas. Possible spreading cellulitis. At the variable points in the history he was also labeled as having DIC and I wonder whether some of this could have been small vessel ischemic  necrosis in this area. Nevertheless he did have surgical IandD's of this area as well. He was readmitted on 7/29 predominantly for worsening swelling of the left thigh. He was diagnosed with left thigh myositis. As noted he did have a biopsy and was ultimately discharged on a further 2 weeks of Augmentin. A CT scan of the abdomen showed diffuse subcutaneous edema and anasarca but without identifiable abscesses. An MRI of the femur was also done. He is now back at home using wet-to-dry dressings daily with normal saline. The patient works as a Designer, fashion/clothing. He is Hispanic and we interviewed him through an interpreter. He does not have medical insurance 8/31; the patient is continued with his wet-to-dry dressings to the 2 areas on the flank more extensive on the right. Comes in today with both sides looking better. The left is just about 100% epithelialized. On the right he had interconnecting areas last time under bridges of skin however all of this is much less open than last week and he appears to have healthy granulation Electronic Signature(s) Signed: 05/01/2021 4:28:25 PM By: Baltazar Najjar MD Entered By: Baltazar Najjar on 05/01/2021 11:47:59 -------------------------------------------------------------------------------- Physical Exam Details Patient Name: Date of Service: Bryce [name]. 05/01/2021 10:00 A M Medical Record Number: [id number] Patient Account Number: [id number] Date of Birth/Sex: Treating RN: [birth year] (35 y.o. Bryce [name] Primary Care Provider: PCP, NO Other Clinician: Referring Provider: Treating Provider/Extender: Susette Racer, Ripley Fraise in Treatment: 2 Constitutional Sitting or standing Blood Pressure is within target range for patient.. Pulse regular and within target range for patient.Marland Kitchen Respirations regular, non-labored and within target range.. Temperature is normal and within  the target range for the patient.Marland Kitchen Appears in no  distress. Notes Wound exam On the right lateral flank most of the areas have adhered to the overlying bridges of skin. This is a lot better than last week. Surface granulation looks healthy and is epithelializing in most areas On the left lateral flank he is just about 100% epithelialized No evidence of surrounding infection Electronic Signature(s) Signed: 05/01/2021 4:28:25 PM By: Baltazar Najjar MD Entered By: Baltazar Najjar on 05/01/2021 11:49:57 -------------------------------------------------------------------------------- Physician Orders Details Patient Name: Date of Service: Bryce [name]. 05/01/2021 10:00 A M Medical Record Number: [id number] Patient Account Number: [id number] Date of Birth/Sex: Treating RN: August 18, 1986 (35 y.o. [name], [name] Primary Care Provider: PCP, NO Other Clinician: Referring Provider: Treating Provider/Extender: Susette Racer, Ripley Fraise in Treatment: 2 Verbal / Phone Orders: No Diagnosis Coding ICD-10 Coding Code Description S31.100D Unspecified open wound of abdominal wall, right upper quadrant without penetration into peritoneal cavity, subsequent encounter S31.101A Unspecified open wound of abdominal wall, left upper quadrant without penetration into peritoneal cavity, initial encounter A40.0 Sepsis due to streptococcus, group A L03.311 Cellulitis of abdominal wall Follow-up Appointments ppointment in 2 weeks. - with Dr. Leanord Hawking Return A Bathing/ Shower/ Hygiene May shower and wash wound with soap and water. Additional Orders / Instructions Follow Nutritious Diet - High Protein Diet Wound Treatment Wound #1 - Flank Wound Laterality: Right Cleanser: Soap and Water 1 x Per Day/30 Days Discharge Instructions: May shower and wash wound with dial antibacterial soap and water prior to dressing change. Secondary Dressing: Woven Gauze Sponge, Non-Sterile 4x4 in 1 x Per Day/30 Days Discharge Instructions: saline  moistened gauze to wound bed Secondary Dressing: ABD Pad, 5x9 1 x Per Day/30 Days Discharge Instructions: Apply over primary dressing as directed. Secured With: Paper Tape, 2x10 (in/yd) 1 x Per Day/30 Days Discharge Instructions: Secure dressing with tape as directed. Wound #2 - Flank Wound Laterality: Left Cleanser: Soap and Water 1 x Per Day/30 Days Discharge Instructions: May shower and wash wound with dial antibacterial soap and water prior to dressing change. Secondary Dressing: Woven Gauze Sponge, Non-Sterile 4x4 in 1 x Per Day/30 Days Discharge Instructions: saline moistened gauze to wound bed Secondary Dressing: ABD Pad, 5x9 1 x Per Day/30 Days Discharge Instructions: Apply over primary dressing as directed. Secured With: Paper Tape, 2x10 (in/yd) 1 x Per Day/30 Days Discharge Instructions: Secure dressing with tape as directed. Electronic Signature(s) Signed: 05/01/2021 4:28:25 PM By: Baltazar Najjar MD Signed: 05/02/2021 4:23:50 PM By: Fonnie Mu RN Signed: 05/02/2021 4:23:50 PM By: Fonnie Mu RN Entered By: Fonnie Mu on 05/01/2021 11:16:30 -------------------------------------------------------------------------------- Problem List Details Patient Name: Date of Service: Bryce [name]. 05/01/2021 10:00 A M Medical Record Number: [id number] Patient Account Number: [id number] Date of Birth/Sex: Treating RN: Jan 25, 1986 (35 y.o. Lucious Groves Primary Care Provider: PCP, NO Other Clinician: Referring Provider: Treating Provider/Extender: Susette Racer, Ripley Fraise in Treatment: 2 Active Problems ICD-10 Encounter Code Description Active Date MDM Diagnosis S31.100D Unspecified open wound of abdominal wall, right upper quadrant without 04/17/2021 No Yes penetration into peritoneal cavity, subsequent encounter S31.101A Unspecified open wound of abdominal wall, left upper quadrant without 04/17/2021 No Yes penetration into peritoneal  cavity, initial encounter A40.0 Sepsis due to streptococcus, group A 04/17/2021 No Yes L03.311 Cellulitis of abdominal wall 04/17/2021 No Yes Inactive Problems Resolved Problems Electronic Signature(s) Signed: 05/01/2021 4:28:25 PM By: Baltazar Najjar MD Entered By: Baltazar Najjar on 05/01/2021 11:44:18 -------------------------------------------------------------------------------- Progress Note Details Patient Name:  Date of Service: Bryce [name], Bryce [name] 05/01/2021 10:00 A M Medical Record Number: [id number] Patient Account Number: [id number] Date of Birth/Sex: Treating RN: [birth year] (35 y.o. Bryce [name] Primary Care Provider: PCP, NO Other Clinician: Referring Provider: Treating Provider/Extender: Susette Racer, Ripley Fraise in Treatment: 2 Subjective History of Present Illness (HPI) ADMISSION 04/17/2021 This is a 35 year old man who came to clinic for review of wounds on his abdomen. History here is a bit complex and difficult to follow however it would appear that he suffered an abdominal bite injury on the mid part of his abdomen just in the area of his umbilicus sometime in July. The wound became necrotic and spreading. This was worsening with redness around the wound. He developed what was probably a group A strep cellulitis in his abdomen he had sepsis secondary to group A strep and required hospitalization from 03/13/2021 through 03/23/2021. During the hospitalization he required a surgical IandD by general surgery on 03/16/2021. Biopsies were done of this area in the central abdomen as well as an area on his left medial thigh just above the knee area. None of these biopsies were conclusive and he currently does not have a wound on the thigh area. The patient was critically ill with hypotension lactic acidosis. He was felt to have group A sepsis secondary to abdominal cellulitis. He was seen by infectious disease. Initially treated with vancomycin and Rocephin and  then tapered to Augmentin. The exact course of the areas on his bilateral flanks is a bit difficult to follow also however these were at 1 point blistered areas. Possible spreading cellulitis. At the variable points in the history he was also labeled as having DIC and I wonder whether some of this could have been small vessel ischemic necrosis in this area. Nevertheless he did have surgical IandD's of this area as well. He was readmitted on 7/29 predominantly for worsening swelling of the left thigh. He was diagnosed with left thigh myositis. As noted he did have a biopsy and was ultimately discharged on a further 2 weeks of Augmentin. A CT scan of the abdomen showed diffuse subcutaneous edema and anasarca but without identifiable abscesses. An MRI of the femur was also done. He is now back at home using wet-to-dry dressings daily with normal saline. The patient works as a Designer, fashion/clothing. He is Hispanic and we interviewed him through an interpreter. He does not have medical insurance 8/31; the patient is continued with his wet-to-dry dressings to the 2 areas on the flank more extensive on the right. Comes in today with both sides looking better. The left is just about 100% epithelialized. On the right he had interconnecting areas last time under bridges of skin however all of this is much less open than last week and he appears to have healthy granulation Objective Constitutional Sitting or standing Blood Pressure is within target range for patient.. Pulse regular and within target range for patient.Marland Kitchen Respirations regular, non-labored and within target range.. Temperature is normal and within the target range for the patient.Marland Kitchen Appears in no distress. Vitals Time Taken: 10:35 AM, Height: 61 in, Weight: 127 lbs, BMI: 24, Temperature: 98.1 F, Pulse: 79 bpm, Respiratory Rate: 17 breaths/min, Blood Pressure: 124/81 mmHg. General Notes: Wound exam oo On the right lateral flank most of the areas have adhered  to the overlying bridges of skin. This is a lot better than last week. Surface granulation looks healthy and is epithelializing in most areas oo On the left lateral flank  he is just about 100% epithelialized oo No evidence of surrounding infection Integumentary (Hair, Skin) Wound #1 status is Open. Original cause of wound was Other Lesion. The date acquired was: 03/18/2021. The wound has been in treatment 2 weeks. The wound is located on the Right Flank. The wound measures 11cm length x 7.4cm width x 0.2cm depth; 63.931cm^2 area and 12.786cm^3 volume. There is Fat Layer (Subcutaneous Tissue) exposed. Tunneling has been noted at 4:00 with a maximum distance of 2.5cm. Undermining begins at 12:00 and ends at 12:00 with a maximum distance of 1.5cm. There is a medium amount of serosanguineous drainage noted. The wound margin is distinct with the outline attached to the wound base. There is large (67-100%) red granulation within the wound bed. There is a small (1-33%) amount of necrotic tissue within the wound bed including Adherent Slough. Wound #2 status is Open. Original cause of wound was Other Lesion. The date acquired was: 03/18/2021. The wound has been in treatment 2 weeks. The wound is located on the Left Flank. The wound measures 0.3cm length x 1.5cm width x 0.1cm depth; 0.353cm^2 area and 0.035cm^3 volume. There is Fat Layer (Subcutaneous Tissue) exposed. There is no tunneling or undermining noted. There is a medium amount of serosanguineous drainage noted. The wound margin is distinct with the outline attached to the wound base. There is large (67-100%) red granulation within the wound bed. There is a small (1-33%) amount of necrotic tissue within the wound bed including Adherent Slough. Assessment Active Problems ICD-10 Unspecified open wound of abdominal wall, right upper quadrant without penetration into peritoneal cavity, subsequent encounter Unspecified open wound of abdominal wall, left  upper quadrant without penetration into peritoneal cavity, initial encounter Sepsis due to streptococcus, group A Cellulitis of abdominal wall Plan Follow-up Appointments: Return Appointment in 2 weeks. - with Dr. Leanord Hawking Bathing/ Shower/ Hygiene: May shower and wash wound with soap and water. Additional Orders / Instructions: Follow Nutritious Diet - High Protein Diet WOUND #1: - Flank Wound Laterality: Right Cleanser: Soap and Water 1 x Per Day/30 Days Discharge Instructions: May shower and wash wound with dial antibacterial soap and water prior to dressing change. Secondary Dressing: Woven Gauze Sponge, Non-Sterile 4x4 in 1 x Per Day/30 Days Discharge Instructions: saline moistened gauze to wound bed Secondary Dressing: ABD Pad, 5x9 1 x Per Day/30 Days Discharge Instructions: Apply over primary dressing as directed. Secured With: Paper Tape, 2x10 (in/yd) 1 x Per Day/30 Days Discharge Instructions: Secure dressing with tape as directed. WOUND #2: - Flank Wound Laterality: Left Cleanser: Soap and Water 1 x Per Day/30 Days Discharge Instructions: May shower and wash wound with dial antibacterial soap and water prior to dressing change. Secondary Dressing: Woven Gauze Sponge, Non-Sterile 4x4 in 1 x Per Day/30 Days Discharge Instructions: saline moistened gauze to wound bed Secondary Dressing: ABD Pad, 5x9 1 x Per Day/30 Days Discharge Instructions: Apply over primary dressing as directed. Secured With: Paper Tape, 2x10 (in/yd) 1 x Per Day/30 Days Discharge Instructions: Secure dressing with tape as directed. 1. The patient was interviewed through an interpreter. He really had no complaints 2. He is finished his antibiotics and to be truthful I do not think any additional antibiotics are necessary 3. Quite a bit of improvement even on the more extensive right side most of the areas that had bridging skin over the top are now adhered. I found myself to be quite surprised by this 4. No  reason to change anything we are doing wet-to-dry dressings  Electronic Signature(s) Signed: 05/01/2021 4:28:25 PM By: Baltazar Najjarobson, Cane Dubray MD Entered By: Baltazar Najjarobson, Breasia Karges on 05/01/2021 11:51:00 -------------------------------------------------------------------------------- SuperBill Details Patient Name: Date of Service: Bryce EmeraldGO NZA [name], [name] 05/01/2021 Medical Record Number: [id number] Patient Account Number: [id number] Date of Birth/Sex: Treating RN: 14-Mar-1986 (35 y.o. Bryce KollerM) Lynch, Shatara Primary Care Provider: PCP, NO Other Clinician: Referring Provider: Treating Provider/Extender: Susette Racerobson, Khailee Mick Vincent, Ripley Fraiseuncan Weeks in Treatment: 2 Diagnosis Coding ICD-10 Codes Code Description S31.100D Unspecified open wound of abdominal wall, right upper quadrant without penetration into peritoneal cavity, subsequent encounter S31.101A Unspecified open wound of abdominal wall, left upper quadrant without penetration into peritoneal cavity, initial encounter A40.0 Sepsis due to streptococcus, group A L03.311 Cellulitis of abdominal wall Facility Procedures CPT4 Code: [id number] Description: 99213 - WOUND CARE VISIT-LEV 3 EST PT Modifier: Quantity: 1 Physician Procedures : CPT4 Code Description Modifier [id number] 99213 - WC PHYS LEVEL 3 - EST PT ICD-10 Diagnosis Description S31.100D Unspecified open wound of abdominal wall, right upper quadrant without penetration into peritoneal cavity subsequent encounter S31.101A  Unspecified open wound of abdominal wall, left upper quadrant without penetration into peritoneal cavity, encounter L03.311 Cellulitis of abdominal wall Quantity: 1 , initial Electronic Signature(s) Signed: 05/01/2021 4:28:25 PM By: Baltazar Najjarobson, Shade Kaley MD Signed: 05/02/2021 5:19:32 PM By: Zandra AbtsLynch, Shatara RN, BSN Entered By: Zandra AbtsLynch, Shatara on 05/01/2021 15:26:23

## 2021-05-02 NOTE — Progress Notes (Signed)
Bryce Bryce [name], Bryce Bryce [name] ([id number]) Visit Report for 05/01/2021 Arrival Information Details Patient Name: Date of Service: Bryce Bryce [name], Bryce [name] 05/01/2021 10:00 A M Medical Record Number: [id number] Patient Account Number: [id number] Date of Birth/Sex: Treating RN: 07/02/1986 (35 y.o. Bryce Bryce [name], Bryce [name] Primary Care Bryce Bryce [name]: PCP, NO Other Clinician: Referring Bryce Bryce [name]: Treating Bryce Bryce [name]/Extender: Bryce Bryce [name], Bryce Bryce [name] in Treatment: 2 Visit Information History Since Last Visit Added or deleted any medications: No Patient Arrived: Ambulatory Any new allergies or adverse reactions: No Arrival Time: 10:35 Had a fall or experienced change in No Accompanied By: interpreter activities of daily living that may affect Transfer Assistance: None risk of falls: Patient Identification Verified: Yes Signs or symptoms of abuse/neglect since last visito No Secondary Verification Process Completed: Yes Hospitalized since last visit: No Patient Requires Transmission-Based Precautions: No Implantable device outside of the clinic excluding No Patient Has Alerts: No cellular tissue based products placed in the center since last visit: Has Dressing in Place as Prescribed: Yes Pain Present Now: Yes Electronic Signature(s) Signed: 05/02/2021 4:23:50 PM By: Fonnie Mu RN Entered By: Fonnie Mu on 05/01/2021 10:35:42 -------------------------------------------------------------------------------- Clinic Level of Care Assessment Details Patient Name: Date of Service: Bryce Bryce [name], Bryce [name] 05/01/2021 10:00 A M Medical Record Number: [id number] Patient Account Number: [id number] Date of Birth/Sex: Treating RN: 1986/03/23 (35 y.o. Bryce Bryce [name] Primary Care Bryce Bryce [name]: PCP, NO Other Clinician: Referring Bryce Bryce [name]: Treating Bryce Bryce [name]/Extender: Bryce Bryce [name], Bryce Bryce [name] in Treatment: 2 Clinic Level of Care Assessment Items TOOL 4 Quantity Score X- 1 0 Use when  only an EandM is performed on FOLLOW-UP visit ASSESSMENTS - Nursing Assessment / Reassessment X- 1 10 Reassessment of Co-morbidities (includes updates in patient status) X- 1 5 Reassessment of Adherence to Treatment Plan ASSESSMENTS - Wound and Skin A ssessment / Reassessment []  - 0 Simple Wound Assessment / Reassessment - one wound X- 2 5 Complex Wound Assessment / Reassessment - multiple wounds []  - 0 Dermatologic / Skin Assessment (not related to wound area) ASSESSMENTS - Focused Assessment []  - 0 Circumferential Edema Measurements - multi extremities []  - 0 Nutritional Assessment / Counseling / Intervention []  - 0 Lower Extremity Assessment (monofilament, tuning fork, pulses) []  - 0 Peripheral Arterial Disease Assessment (using hand held doppler) ASSESSMENTS - Ostomy and/or Continence Assessment and Care []  - 0 Incontinence Assessment and Management []  - 0 Ostomy Care Assessment and Management (repouching, etc.) PROCESS - Coordination of Care X - Simple Patient / Family Education for ongoing care 1 15 []  - 0 Complex (extensive) Patient / Family Education for ongoing care X- 1 10 Staff obtains , Records, T Results / Process Orders est []  - 0 Staff telephones HHA, Nursing Homes / Clarify orders / etc []  - 0 Routine Transfer to another Facility (non-emergent condition) []  - 0 Routine Hospital Admission (non-emergent condition) []  - 0 New Admissions / / Ordering NPWT Apligraf, etc. , []  - 0 Emergency Hospital Admission (emergent condition) X- 1 10 Simple Discharge Coordination []  - 0 Complex (extensive) Discharge Coordination PROCESS - Special Needs []  - 0 Pediatric / Minor Patient Management []  - 0 Isolation Patient Management []  - 0 Hearing / Language / Visual special needs []  - 0 Assessment of Community assistance (transportation, D/C planning, etc.) []  - 0 Additional assistance / Altered mentation []  - 0 Support  Surface(s) Assessment (bed, cushion, seat, etc.) INTERVENTIONS - Wound Cleansing / Measurement []  - 0 Simple Wound Cleansing - one wound X- 2 5 Complex Wound Cleansing - multiple  wounds X- 1 5 Wound Imaging (photographs - any number of wounds) []  - 0 Wound Tracing (instead of photographs) []  - 0 Simple Wound Measurement - one wound X- 2 5 Complex Wound Measurement - multiple wounds INTERVENTIONS - Wound Dressings X - Small Wound Dressing one or multiple wounds 2 10 []  - 0 Medium Wound Dressing one or multiple wounds []  - 0 Large Wound Dressing one or multiple wounds []  - 0 Application of Medications - topical []  - 0 Application of Medications - injection INTERVENTIONS - Miscellaneous []  - 0 External ear exam []  - 0 Specimen Collection (cultures, biopsies, blood, body fluids, etc.) []  - 0 Specimen(s) / Culture(s) sent or taken to Lab for analysis []  - 0 Patient Transfer (multiple staff / / Similar devices) []  - 0 Simple Staple / Suture removal (25 or less) []  - 0 Complex Staple / Suture removal (26 or more) []  - 0 Hypo / Hyperglycemic Management (close monitor of Blood Glucose) []  - 0 Ankle / Brachial Index (ABI) - do not check if billed separately X- 1 5 Vital Signs Has the patient been seen at the hospital within the last three years: Yes Total Score: 110 Level Of Care: New/Established - Level 3 Electronic Signature(s) Signed: 05/02/2021 5:19:32 PM By: RN, BSN Entered By: on 05/01/2021 15:26:17 -------------------------------------------------------------------------------- Encounter Discharge Information Details Patient Name: Date of Service: . 05/01/2021 10:00 A M Medical Record Number: Patient Account Number: Date of Birth/Sex: Treating RN: June 29, 1986 (35 y.o. Primary Care Hobert Poplaski: PCP, NO Other Clinician: Referring Jayleah Garbers: Treating Conrado Nance/Extender:  , in Treatment: 2 Encounter Discharge Information Items Discharge Condition: Stable Ambulatory Status: Ambulatory Discharge Destination: Home Transportation: Private Auto Accompanied By: interpreter Schedule Follow-up Appointment: Yes Clinical Summary of Care: Patient Declined Electronic Signature(s) Signed: 05/02/2021 5:19:32 PM By: 07/02/2021 RN, BSN Entered By: Zandra Abts on 05/01/2021 15:26:59 -------------------------------------------------------------------------------- Lower Extremity Assessment Details Patient Name: Date of Service: 05/03/2021. 05/01/2021 10:00 A M Medical Record Number: 05/03/2021 Patient Account Number: [id number] Date of Birth/Sex: Treating RN: [birth year] (34 y.o. 31 Primary Care Marthena Whitmyer: PCP, NO Other Clinician: Referring Karmella Bouvier: Treating Mattelyn Imhoff/Extender: Bryce Bryce [name], Bryce Bryce [name] in Treatment: 2 Electronic Signature(s) Signed: 05/02/2021 4:23:50 PM By: 07/02/2021 RN Entered By: Zandra Abts on 05/01/2021 10:50:10 -------------------------------------------------------------------------------- Multi Wound Chart Details Patient Name: Date of Service: 05/03/2021. 05/01/2021 10:00 A M Medical Record Number: 05/03/2021 Patient Account Number: [id number] Date of Birth/Sex: Treating RN: [birth year] (35 y.o. 31 Primary Care Shamera Yarberry: PCP, NO Other Clinician: Referring Yetunde Leis: Treating Deliah Strehlow/Extender: Lucious Groves, Bryce Bryce [name] in Treatment: 2 Vital Signs Height(in): 61 Pulse(bpm): 79 Weight(lbs): 127 Blood Pressure(mmHg): 124/81 Body Mass Index(BMI): 24 Temperature(F): 98.1 Respiratory Rate(breaths/min): 17 Photos: [1:No Photos Right Flank] [2:No Photos Left Flank] [N/A:N/A N/A] Wound Location: [1:Other Lesion] [2:Other Lesion] [N/A:N/A] Wounding Event: [1:Infection - not elsewhere classified Infection - not  elsewhere classified N/A] Primary Etiology: [1:Anemia] [2:Anemia] [N/A:N/A] Comorbid History: [1:03/18/2021] [2:03/18/2021] [N/A:N/A] Date Acquired: [1:2] [2:2] [N/A:N/A] Weeks of Treatment: [1:Open] [2:Open] [N/A:N/A] Wound Status: [1:Yes] [2:No] [N/A:N/A] Clustered Wound: [1:4] [2:N/A] [N/A:N/A] Clustered Quantity: [1:11x7.4x0.2] [2:0.3x1.5x0.1] [N/A:N/A] Measurements L x W x D (cm) [1:63.931] [2:0.353] [N/A:N/A] A (cm) : rea [1:12.786] [2:0.035] [N/A:N/A] Volume (cm) : [1:31.90%] [2:97.30%] [N/A:N/A] % Reduction in A rea: [1:54.60%] [2:98.60%] [N/A:N/A] % Reduction in Volume: [1:4] Position 1 (o'clock): [1:2.5] Maximum Distance 1 (cm): [1:12]  Starting Position 1 (o'clock): [1:12] Ending Position 1 (o'clock): [1:1.5] Maximum Distance 1 (cm): [1:Yes] [2:No] [N/A:N/A] Tunneling: [1:Yes] [2:No] [N/A:N/A] Undermining: [1:Full Thickness Without Exposed] [2:Full Thickness Without Exposed] [N/A:N/A] Classification: [1:Support Structures Medium] [2:Support Structures Medium] [N/A:N/A] Exudate Amount: [1:Serosanguineous] [2:Serosanguineous] [N/A:N/A] Exudate Type: [1:red, brown] [2:red, brown] [N/A:N/A] Exudate Color: [1:Distinct, outline attached] [2:Distinct, outline attached] [N/A:N/A] Wound Margin: [1:Large (67-100%)] [2:Large (67-100%)] [N/A:N/A] Granulation Amount: [1:Red] [2:Red] [N/A:N/A] Granulation Quality: [1:Small (1-33%)] [2:Small (1-33%)] [N/A:N/A] Necrotic Amount: [1:Fat Layer (Subcutaneous Tissue): Yes Fat Layer (Subcutaneous Tissue): Yes N/A] Exposed Structures: [1:Fascia: No Tendon: No Muscle: No Joint: No Bone: No Small (1-33%)] [2:Fascia: No Tendon: No Muscle: No Joint: No Bone: No None] [N/A:N/A] Treatment Notes Electronic Signature(s) Signed: 05/01/2021 4:28:25 PM By: Baltazar Najjar MD Signed: 05/02/2021 5:19:32 PM By: Zandra Abts RN, BSN Entered By: Baltazar Najjar on 05/01/2021  11:44:25 -------------------------------------------------------------------------------- Multi-Disciplinary Care Plan Details Patient Name: Date of Service: Bryce Bryce [name]. 05/01/2021 10:00 A M Medical Record Number: [id number] Patient Account Number: [id number] Date of Birth/Sex: Treating RN: February 01, 1986 (35 y.o. Bryce Bryce [name] Primary Care Carlo Guevarra: PCP, NO Other Clinician: Referring Arlo Butt: Treating Samvel Zinn/Extender: Bryce Bryce [name], Bryce Bryce [name] in Treatment: 2 Active Inactive Wound/Skin Impairment Nursing Diagnoses: Impaired tissue integrity Goals: Patient/caregiver will verbalize understanding of skin care regimen Date Initiated: 04/17/2021 Target Resolution Date: 05/15/2021 Goal Status: Active Ulcer/skin breakdown will have a volume reduction of 30% by week 4 Date Initiated: 04/17/2021 Target Resolution Date: 05/15/2021 Goal Status: Active Interventions: Assess patient/caregiver ability to obtain necessary supplies Assess patient/caregiver ability to perform ulcer/skin care regimen upon admission and as needed Assess ulceration(s) every visit Provide education on ulcer and skin care Treatment Activities: Topical wound management initiated : 04/17/2021 Notes: Electronic Signature(s) Signed: 05/02/2021 5:19:32 PM By: Zandra Abts RN, BSN Entered By: Zandra Abts on 05/01/2021 15:23:14 -------------------------------------------------------------------------------- Pain Assessment Details Patient Name: Date of Service: Bryce Bryce [name] 05/01/2021 10:00 A M Medical Record Number: [id number] Patient Account Number: [id number] Date of Birth/Sex: Treating RN: 1985/12/23 (35 y.o. Lucious Groves Primary Care Miesha Bachmann: PCP, NO Other Clinician: Referring Synethia Endicott: Treating Avyaan Summer/Extender: Bryce Bryce [name], Bryce Bryce [name] in Treatment: 2 Active Problems Location of Pain Severity and Description of Pain Patient Has Paino Yes Site  Locations Pain Location: Pain in Ulcers With Dressing Change: Yes Rate the pain. Current Pain Level: 5 Worst Pain Level: 10 Least Pain Level: 0 Tolerable Pain Level: 5 Pain Management and Medication Current Pain Management: Electronic Signature(s) Signed: 05/02/2021 4:23:50 PM By: Fonnie Mu RN Entered By: Fonnie Mu on 05/01/2021 10:36:24 -------------------------------------------------------------------------------- Patient/Caregiver Education Details Patient Name: Date of Service: Bryce Bryce [name], [name] 8/31/2022andnbsp10:00 A M Medical Record Number: [id number] Patient Account Number: [id number] Date of Birth/Gender: Treating RN: 28-Aug-1986 (35 y.o. Bryce Bryce [name] Primary Care Physician: PCP, NO Other Clinician: Referring Physician: Treating Physician/Extender: Roderic Ovens in Treatment: 2 Education Assessment Education Provided To: Patient Education Topics Provided Wound/Skin Impairment: Methods: Explain/Verbal Responses: State content correctly Electronic Signature(s) Signed: 05/02/2021 5:19:32 PM By: Zandra Abts RN, BSN Entered By: Zandra Abts on 05/01/2021 15:23:41 -------------------------------------------------------------------------------- Wound Assessment Details Patient Name: Date of Service: Bryce Bryce [name] 05/01/2021 10:00 A M Medical Record Number: [id number] Patient Account Number: [id number] Date of Birth/Sex: Treating RN: 01-18-1986 (35 y.o. Lucious Groves Primary Care Kellon Chalk: PCP, NO Other Clinician: Referring Raylea Adcox: Treating Medora Roorda/Extender: Bryce Bryce [name], Bryce Bryce [name] in Treatment: 2 Wound Status Wound Number: 1 Primary Etiology: Infection - not elsewhere classified Wound Location: Right Flank  Wound Status: Open Wounding Event: Other Lesion Comorbid History: Anemia Date Acquired: 03/18/2021 Weeks Of Treatment: 2 Clustered Wound: Yes Wound Measurements Length:  (cm) 11 Width: (cm) 7.4 Depth: (cm) 0.2 Clustered Quantity: 4 Area: (cm) 63.931 Volume: (cm) 12.786 % Reduction in Area: 31.9% % Reduction in Volume: 54.6% Epithelialization: Small (1-33%) Tunneling: Yes Position (o'clock): 4 Maximum Distance: (cm) 2.5 Undermining: Yes Starting Position (o'clock): 12 Ending Position (o'clock): 12 Maximum Distance: (cm) 1.5 Wound Description Classification: Full Thickness Without Exposed Support Structures Wound Margin: Distinct, outline attached Exudate Amount: Medium Exudate Type: Serosanguineous Exudate Color: red, brown Foul Odor After Cleansing: No Slough/Fibrino Yes Wound Bed Granulation Amount: Large (67-100%) Exposed Structure Granulation Quality: Red Fascia Exposed: No Necrotic Amount: Small (1-33%) Fat Layer (Subcutaneous Tissue) Exposed: Yes Necrotic Quality: Adherent Slough Tendon Exposed: No Muscle Exposed: No Joint Exposed: No Bone Exposed: No Treatment Notes Wound #1 (Flank) Wound Laterality: Right Cleanser Soap and Water Discharge Instruction: May shower and wash wound with dial antibacterial soap and water prior to dressing change. Peri-Wound Care Topical Primary Dressing Secondary Dressing Woven Gauze Sponge, Non-Sterile 4x4 in Discharge Instruction: saline moistened gauze to wound bed ABD Pad, 5x9 Discharge Instruction: Apply over primary dressing as directed. Secured With Paper Tape, 2x10 (in/yd) Discharge Instruction: Secure dressing with tape as directed. Compression Wrap Compression Stockings Add-Ons Electronic Signature(s) Signed: 05/02/2021 4:23:50 PM By: Fonnie MuBreedlove, [name] RN Entered By: Fonnie MuBreedlove, Bryce [name] on 05/01/2021 10:52:56 -------------------------------------------------------------------------------- Wound Assessment Details Patient Name: Date of Service: Bryce EmeraldGO NZA Bryce [name], [name] 05/01/2021 10:00 A M Medical Record Number: [id number] Patient Account Number: [id number] Date of  Birth/Sex: Treating RN: 1986-01-12 (35 y.o. Bryce MerlM) Breedlove, Bryce [name] Primary Care Falon Flinchum: PCP, NO Other Clinician: Referring Valery Amedee: Treating Symia Herdt/Extender: Bryce Racerobson, Michael Vincent, Bryce Fraiseuncan Weeks in Treatment: 2 Wound Status Wound Number: 2 Primary Etiology: Infection - not elsewhere classified Wound Location: Left Flank Wound Status: Open Wounding Event: Other Lesion Comorbid History: Anemia Date Acquired: 03/18/2021 Weeks Of Treatment: 2 Clustered Wound: No Wound Measurements Length: (cm) 0.3 Width: (cm) 1.5 Depth: (cm) 0.1 Area: (cm) 0.353 Volume: (cm) 0.035 % Reduction in Area: 97.3% % Reduction in Volume: 98.6% Epithelialization: None Tunneling: No Undermining: No Wound Description Classification: Full Thickness Without Exposed Support Structu Wound Margin: Distinct, outline attached Exudate Amount: Medium Exudate Type: Serosanguineous Exudate Color: red, brown res Foul Odor After Cleansing: No Slough/Fibrino Yes Wound Bed Granulation Amount: Large (67-100%) Exposed Structure Granulation Quality: Red Fascia Exposed: No Necrotic Amount: Small (1-33%) Fat Layer (Subcutaneous Tissue) Exposed: Yes Necrotic Quality: Adherent Slough Tendon Exposed: No Muscle Exposed: No Joint Exposed: No Bone Exposed: No Treatment Notes Wound #2 (Flank) Wound Laterality: Left Cleanser Soap and Water Discharge Instruction: May shower and wash wound with dial antibacterial soap and water prior to dressing change. Peri-Wound Care Topical Primary Dressing Secondary Dressing Woven Gauze Sponge, Non-Sterile 4x4 in Discharge Instruction: saline moistened gauze to wound bed ABD Pad, 5x9 Discharge Instruction: Apply over primary dressing as directed. Secured With Paper Tape, 2x10 (in/yd) Discharge Instruction: Secure dressing with tape as directed. Compression Wrap Compression Stockings Add-Ons Electronic Signature(s) Signed: 05/02/2021 4:23:50 PM By: Fonnie MuBreedlove, [name]  RN Entered By: Fonnie MuBreedlove, Bryce [name] on 05/01/2021 10:53:34 -------------------------------------------------------------------------------- Vitals Details Patient Name: Date of Service: Bryce EmeraldGO NZA Bryce [name], [name] 05/01/2021 10:00 A M Medical Record Number: [id number] Patient Account Number: [id number] Date of Birth/Sex: Treating RN: 1986-01-12 (35 y.o. Lucious GrovesM) Breedlove, Bryce [name] Primary Care Manpreet Kemmer: PCP, NO Other Clinician: Referring Sanae Willetts: Treating Shariyah Eland/Extender: Bryce Racerobson, Michael Vincent, Bryce Fraiseuncan Weeks in Treatment: 2 Vital Signs Time Taken:  10:35 Temperature (F): 98.1 Height (in): 61 Pulse (bpm): 79 Weight (lbs): 127 Respiratory Rate (breaths/min): 17 Body Mass Index (BMI): 24 Blood Pressure (mmHg): 124/81 Reference Range: 80 - 120 mg / dl Electronic Signature(s) Signed: 05/02/2021 4:23:50 PM By: Fonnie Mu RN Entered By: Fonnie Mu on 05/01/2021 10:36:04

## 2021-05-15 ENCOUNTER — Encounter (HOSPITAL_BASED_OUTPATIENT_CLINIC_OR_DEPARTMENT_OTHER): Payer: No Typology Code available for payment source | Admitting: Internal Medicine

## 2021-05-22 ENCOUNTER — Other Ambulatory Visit: Payer: Self-pay

## 2021-05-22 ENCOUNTER — Encounter (HOSPITAL_BASED_OUTPATIENT_CLINIC_OR_DEPARTMENT_OTHER): Payer: Self-pay | Attending: Internal Medicine | Admitting: Internal Medicine

## 2021-05-22 DIAGNOSIS — L03311 Cellulitis of abdominal wall: Secondary | ICD-10-CM | POA: Insufficient documentation

## 2021-05-22 NOTE — Progress Notes (Addendum)
Bryce [name], Bryce [name] ([id number]) Visit Report for 05/22/2021 Arrival Information Details Patient Name: Date of Service: Bryce [name], Bryce [name] 05/22/2021 8:30 A M Medical Record Number: [id number] Patient Account Number: [id number] Date of Birth/Sex: Treating RN: 12-May-1986 (35 y.o. Lytle Michaels Primary Care Francie Keeling: PCP, NO Other Clinician: Referring Becci Batty: Treating Kristin Barcus/Extender: Susette Racer, Ripley Fraise in Treatment: 5 Visit Information History Since Last Visit Added or deleted any medications: No Patient Arrived: Ambulatory Any new allergies or adverse reactions: No Arrival Time: 08:55 Had a fall or experienced change in No Transfer Assistance: None activities of daily living that may affect Patient Identification Verified: Yes risk of falls: Secondary Verification Process Completed: Yes Signs or symptoms of abuse/neglect since last visito No Patient Requires Transmission-Based Precautions: No Hospitalized since last visit: No Patient Has Alerts: No Implantable device outside of the clinic excluding No cellular tissue based products placed in the center since last visit: Has Dressing in Place as Prescribed: Yes Pain Present Now: No Electronic Signature(s) Signed: 05/22/2021 5:07:25 PM By: Antonieta Iba Entered By: Antonieta Iba on 05/22/2021 08:56:27 -------------------------------------------------------------------------------- Encounter Discharge Information Details Patient Name: Date of Service: Bryce Emerald. 05/22/2021 8:30 A M Medical Record Number: [id number] Patient Account Number: [id number] Date of Birth/Sex: Treating RN: 02/17/1986 (35 y.o. Lytle Michaels Primary Care Rajohn Henery: PCP, NO Other Clinician: Referring Yareth Macdonnell: Treating Richetta Cubillos/Extender: Susette Racer, Ripley Fraise in Treatment: 5 Encounter Discharge Information Items Post Procedure Vitals Discharge Condition: Stable Temperature (F): 98 Ambulatory  Status: Ambulatory Pulse (bpm): 60 Discharge Destination: Home Respiratory Rate (breaths/min): 16 Transportation: Private Auto Blood Pressure (mmHg): 130/79 Accompanied By: Translator Schedule Follow-up Appointment: Yes Clinical Summary of Care: Provided on 05/22/2021 Form Type Recipient Paper Patient Patient Electronic Signature(s) Signed: 05/22/2021 5:07:25 PM By: Antonieta Iba Entered By: Antonieta Iba on 05/22/2021 09:23:59 -------------------------------------------------------------------------------- Lower Extremity Assessment Details Patient Name: Date of Service: Bryce Emerald 05/22/2021 8:30 A M Medical Record Number: [id number] Patient Account Number: [id number] Date of Birth/Sex: Treating RN: [birth year] (35 y.o. Lytle Michaels Primary Care Fortunato Nordin: PCP, NO Other Clinician: Referring Duff Pozzi: Treating Brindy Higginbotham/Extender: Susette Racer, Ripley Fraise in Treatment: 5 Electronic Signature(s) Signed: 05/22/2021 5:07:25 PM By: Antonieta Iba Entered By: Antonieta Iba on 05/22/2021 09:00:33 -------------------------------------------------------------------------------- Multi Wound Chart Details Patient Name: Date of Service: Bryce Emerald. 05/22/2021 8:30 A M Medical Record Number: [id number] Patient Account Number: [id number] Date of Birth/Sex: Treating RN: [birth year] (35 y.o. Elizebeth Koller Primary Care Athanasia Stanwood: PCP, NO Other Clinician: Referring Ludmilla Mcgillis: Treating Lathon Adan/Extender: Susette Racer, Ripley Fraise in Treatment: 5 Vital Signs Height(in): 61 Pulse(bpm): 60 Weight(lbs): 127 Blood Pressure(mmHg): 130/79 Body Mass Index(BMI): 24 Temperature(F): 98 Respiratory Rate(breaths/min): 16 Photos: [N/A:N/A] Right Flank Left Flank N/A Wound Location: Other Lesion Other Lesion N/A Wounding Event: Infection - not elsewhere classified Infection - not elsewhere classified N/A Primary Etiology: Anemia Anemia  N/A Comorbid History: 03/18/2021 03/18/2021 N/A Date Acquired: 5 5 N/A Weeks of Treatment: Open Open N/A Wound Status: Yes No N/A Clustered Wound: 4 N/A N/A Clustered Quantity: 0.9x2.5x0.1 0.3x1.8x0.1 N/A Measurements L x W x D (cm) 1.767 0.424 N/A A (cm) : rea 0.177 0.042 N/A Volume (cm) : 98.10% 96.70% N/A % Reduction in A rea: 99.40% 98.40% N/A % Reduction in Volume: Full Thickness Without Exposed Full Thickness Without Exposed N/A Classification: Support Structures Support Structures Medium Medium N/A Exudate Amount: Serosanguineous Serosanguineous N/A Exudate Type: red, brown red, brown N/A Exudate Color: Distinct, outline attached Distinct, outline attached  N/A Wound Margin: Large (67-100%) Large (67-100%) N/A Granulation Amount: Red Red N/A Granulation Quality: Small (1-33%) None Present (0%) N/A Necrotic Amount: Fat Layer (Subcutaneous Tissue): Yes Fat Layer (Subcutaneous Tissue): Yes N/A Exposed Structures: Fascia: No Fascia: No Tendon: No Tendon: No Muscle: No Muscle: No Joint: No Joint: No Bone: No Bone: No Small (1-33%) Large (67-100%) N/A Epithelialization: Debridement - Selective/Open Wound N/A N/A Debridement: Pre-procedure Verification/Time Out 09:14 N/A N/A Taken: Lidocaine 4% Topical Solution N/A N/A Pain Control: Callus N/A N/A Tissue Debrided: Skin/Dermis N/A N/A Level: 2.25 N/A N/A Debridement A (sq cm): rea Curette N/A N/A Instrument: Minimum N/A N/A Bleeding: Pressure N/A N/A Hemostasis A chieved: Procedure was tolerated well N/A N/A Debridement Treatment Response: 0.9x2.5x0.1 N/A N/A Post Debridement Measurements L x W x D (cm) 0.177 N/A N/A Post Debridement Volume: (cm) Debridement N/A N/A Procedures Performed: Treatment Notes Wound #1 (Flank) Wound Laterality: Right Cleanser Soap and Water Discharge Instruction: May shower and wash wound with dial antibacterial soap and water prior to dressing  change. Peri-Wound Care Topical Primary Dressing Secondary Dressing Woven Gauze Sponge, Non-Sterile 4x4 in Discharge Instruction: saline moistened gauze to wound bed ABD Pad, 5x9 Discharge Instruction: Apply over primary dressing as directed. Secured With Paper Tape, 2x10 (in/yd) Discharge Instruction: Secure dressing with tape as directed. Compression Wrap Compression Stockings Add-Ons Wound #2 (Flank) Wound Laterality: Left Cleanser Soap and Water Discharge Instruction: May shower and wash wound with dial antibacterial soap and water prior to dressing change. Peri-Wound Care Topical Primary Dressing Secondary Dressing Woven Gauze Sponge, Non-Sterile 4x4 in Discharge Instruction: saline moistened gauze to wound bed ABD Pad, 5x9 Discharge Instruction: Apply over primary dressing as directed. Secured With Paper Tape, 2x10 (in/yd) Discharge Instruction: Secure dressing with tape as directed. Compression Wrap Compression Stockings Add-Ons Electronic Signature(s) Signed: 05/22/2021 4:56:16 PM By: Baltazar Najjar MD Signed: 05/22/2021 5:56:57 PM By: Zandra Abts RN, BSN Entered By: Baltazar Najjar on 05/22/2021 09:57:23 -------------------------------------------------------------------------------- Multi-Disciplinary Care Plan Details Patient Name: Date of Service: Bryce Emerald. 05/22/2021 8:30 A M Medical Record Number: [id number] Patient Account Number: [id number] Date of Birth/Sex: Treating RN: 1986/04/19 (35 y.o. Lytle Michaels Primary Care Nephtali Docken: PCP, NO Other Clinician: Referring Evania Lyne: Treating Shray Hunley/Extender: Susette Racer, Ripley Fraise in Treatment: 5 Active Inactive Electronic Signature(s) Signed: 06/27/2021 5:46:46 PM By: Antonieta Iba Previous Signature: 05/22/2021 5:07:25 PM Version By: Antonieta Iba Entered By: Antonieta Iba on 06/27/2021  17:46:46 -------------------------------------------------------------------------------- Pain Assessment Details Patient Name: Date of Service: Bryce Emerald 05/22/2021 8:30 A M Medical Record Number: [id number] Patient Account Number: [id number] Date of Birth/Sex: Treating RN: May 23, 1986 (35 y.o. Lytle Michaels Primary Care Blasa Raisch: PCP, NO Other Clinician: Referring Detavious Rinn: Treating Gerard Cantara/Extender: Susette Racer, Ripley Fraise in Treatment: 5 Active Problems Location of Pain Severity and Description of Pain Patient Has Paino No Site Locations Pain Management and Medication Current Pain Management: Electronic Signature(s) Signed: 05/22/2021 5:07:25 PM By: Antonieta Iba Entered By: Antonieta Iba on 05/22/2021 09:00:23 -------------------------------------------------------------------------------- Patient/Caregiver Education Details Patient Name: Date of Service: Bryce [name] 9/21/2022andnbsp8:30 A M Medical Record Number: [id number] Patient Account Number: [id number] Date of Birth/Gender: Treating RN: 12-21-1985 (35 y.o. Lytle Michaels Primary Care Physician: PCP, NO Other Clinician: Referring Physician: Treating Physician/Extender: Roderic Ovens in Treatment: 5 Education Assessment Education Provided To: Patient Education Topics Provided Wound/Skin Impairment: Methods: Demonstration, Explain/Verbal, Printed Responses: State content correctly Electronic Signature(s) Signed: 05/22/2021 5:07:25 PM By: Antonieta Iba Entered By: Antonieta Iba  on 05/22/2021 09:11:12 -------------------------------------------------------------------------------- Wound Assessment Details Patient Name: Date of Service: Bryce [name], Bryce [name] 05/22/2021 8:30 A M Medical Record Number: [id number] Patient Account Number: [id number] Date of Birth/Sex: Treating RN: Jan 29, 1986 (35 y.o. Lytle Michaels Primary Care Fayette Hamada:  PCP, NO Other Clinician: Referring Keasia Dubose: Treating Matisse Roskelley/Extender: Susette Racer, Ripley Fraise in Treatment: 5 Wound Status Wound Number: 1 Primary Etiology: Infection - not elsewhere classified Wound Location: Right Flank Wound Status: Healed - Epithelialized Wounding Event: Other Lesion Comorbid History: Anemia Date Acquired: 03/18/2021 Weeks Of Treatment: 5 Clustered Wound: Yes Photos Wound Measurements Length: (cm) Width: (cm) Depth: (cm) Area: (cm) Volume: (cm) 0 % Reduction in Area: 100% 0 % Reduction in Volume: 100% 0 0 0 Wound Description Classification: Full Thickness Without Exposed Support Structur es Assessment Notes 06/27/21: Patient called and states wounds healed. Treatment Notes Wound #1 (Flank) Wound Laterality: Right Cleanser Soap and Water Discharge Instruction: May shower and wash wound with dial antibacterial soap and water prior to dressing change. Peri-Wound Care Topical Primary Dressing Secondary Dressing Woven Gauze Sponge, Non-Sterile 4x4 in Discharge Instruction: saline moistened gauze to wound bed ABD Pad, 5x9 Discharge Instruction: Apply over primary dressing as directed. Secured With Paper Tape, 2x10 (in/yd) Discharge Instruction: Secure dressing with tape as directed. Compression Wrap Compression Stockings Add-Ons Electronic Signature(s) Signed: 06/27/2021 5:45:58 PM By: Antonieta Iba Previous Signature: 05/22/2021 5:07:25 PM Version By: Antonieta Iba Entered By: Antonieta Iba on 06/27/2021 17:45:58 -------------------------------------------------------------------------------- Wound Assessment Details Patient Name: Date of Service: Bryce Emerald 05/22/2021 8:30 A M Medical Record Number: [id number] Patient Account Number: [id number] Date of Birth/Sex: Treating RN: 04-Jun-1986 (35 y.o. Lytle Michaels Primary Care Amed Datta: PCP, NO Other Clinician: Referring Sherryl Valido: Treating  Bern Fare/Extender: Susette Racer, Ripley Fraise in Treatment: 5 Wound Status Wound Number: 2 Primary Etiology: Infection - not elsewhere classified Wound Location: Left Flank Wound Status: Healed - Epithelialized Wounding Event: Other Lesion Comorbid History: Anemia Date Acquired: 03/18/2021 Weeks Of Treatment: 5 Clustered Wound: No Photos Wound Measurements Length: (cm) 0 Width: (cm) 0 Depth: (cm) 0 Area: (cm) 0 Volume: (cm) 0 % Reduction in Area: 100% % Reduction in Volume: 100% Wound Description Classification: Full Thickness Without Exposed Support Structur es Assessment Notes 06/27/21: Patient called and states wounds healed. Treatment Notes Wound #2 (Flank) Wound Laterality: Left Cleanser Soap and Water Discharge Instruction: May shower and wash wound with dial antibacterial soap and water prior to dressing change. Peri-Wound Care Topical Primary Dressing Secondary Dressing Woven Gauze Sponge, Non-Sterile 4x4 in Discharge Instruction: saline moistened gauze to wound bed ABD Pad, 5x9 Discharge Instruction: Apply over primary dressing as directed. Secured With Paper Tape, 2x10 (in/yd) Discharge Instruction: Secure dressing with tape as directed. Compression Wrap Compression Stockings Add-Ons Electronic Signature(s) Signed: 06/27/2021 5:46:21 PM By: Antonieta Iba Previous Signature: 05/22/2021 5:07:25 PM Version By: Antonieta Iba Entered By: Antonieta Iba on 06/27/2021 17:46:21 -------------------------------------------------------------------------------- Vitals Details Patient Name: Date of Service: Bryce Emerald. 05/22/2021 8:30 A M Medical Record Number: [id number] Patient Account Number: [id number] Date of Birth/Sex: Treating RN: 03/12/1986 (35 y.o. Lytle Michaels Primary Care Luanna Weesner: PCP, NO Other Clinician: Referring Fairley Copher: Treating Matthan Sledge/Extender: Susette Racer, Ripley Fraise in Treatment: 5 Vital  Signs Time Taken: 08:58 Temperature (F): 98 Height (in): 61 Pulse (bpm): 60 Weight (lbs): 127 Respiratory Rate (breaths/min): 16 Body Mass Index (BMI): 24 Blood Pressure (mmHg): 130/79 Reference Range: 80 - 120 mg / dl Electronic Signature(s) Signed: 05/22/2021 5:07:25 PM By: Antonieta Iba  Entered By: Antonieta Iba on 05/22/2021 08:59:42

## 2021-05-22 NOTE — Progress Notes (Signed)
Bryce, [name] ([id number]) Visit Report for 05/22/2021 Debridement Details Patient Name: Date of Service: Bryce [name], Bryce [name] 05/22/2021 8:30 A M Medical Record Number: [id number] Patient Account Number: [id number] Date of Birth/Sex: Treating RN: October 27, 1985 (35 y.o. Elizebeth Koller Primary Care Provider: PCP, NO Other Clinician: Referring Provider: Treating Provider/Extender: Susette Racer, Ripley Fraise in Treatment: 5 Debridement Performed for Assessment: Wound #1 Right Flank Performed By: Physician Maxwell Caul., MD Debridement Type: Debridement Level of Consciousness (Pre-procedure): Awake and Alert Pre-procedure Verification/Time Out Yes - 09:14 Taken: Start Time: 09:15 Pain Control: Lidocaine 4% T opical Solution T Area Debrided (L x W): otal 0.9 (cm) x 2.5 (cm) = 2.25 (cm) Tissue and other material debrided: Non-Viable, Callus, Eschar, Skin: Dermis Level: Skin/Dermis Debridement Description: Selective/Open Wound Instrument: Curette Bleeding: Minimum Hemostasis Achieved: Pressure End Time: 09:19 Response to Treatment: Procedure was tolerated well Level of Consciousness (Post- Awake and Alert procedure): Post Debridement Measurements of Total Wound Length: (cm) 0.9 Width: (cm) 2.5 Depth: (cm) 0.1 Volume: (cm) 0.177 Character of Wound/Ulcer Post Debridement: Stable Post Procedure Diagnosis Same as Pre-procedure Electronic Signature(s) Signed: 05/22/2021 4:56:16 PM By: Baltazar Najjar MD Signed: 05/22/2021 5:56:57 PM By: Zandra Abts RN, BSN Entered By: Baltazar Najjar on 05/22/2021 09:57:40 -------------------------------------------------------------------------------- HPI Details Patient Name: Date of Service: Bryce [name]. 05/22/2021 8:30 A M Medical Record Number: [id number] Patient Account Number: [id number] Date of Birth/Sex: Treating RN: 09/26/1985 (35 y.o. Elizebeth Koller Primary Care Provider: PCP, NO Other  Clinician: Referring Provider: Treating Provider/Extender: Susette Racer, Ripley Fraise in Treatment: 5 History of Present Illness HPI Description: ADMISSION 04/17/2021 This is a 35 year old man who came to clinic for review of wounds on his abdomen. History here is a bit complex and difficult to follow however it would appear that he suffered an abdominal bite injury on the mid part of his abdomen just in the area of his umbilicus sometime in July. The wound became necrotic and spreading. This was worsening with redness around the wound. He developed what was probably a group A strep cellulitis in his abdomen he had sepsis secondary to group A strep and required hospitalization from 03/13/2021 through 03/23/2021. During the hospitalization he required a surgical IandD by general surgery on 03/16/2021. Biopsies were done of this area in the central abdomen as well as an area on his left medial thigh just above the knee area. None of these biopsies were conclusive and he currently does not have a wound on the thigh area. The patient was critically ill with hypotension lactic acidosis. He was felt to have group A sepsis secondary to abdominal cellulitis. He was seen by infectious disease. Initially treated with vancomycin and Rocephin and then tapered to Augmentin. The exact course of the areas on his bilateral flanks is a bit difficult to follow also however these were at 1 point blistered areas. Possible spreading cellulitis. At the variable points in the history he was also labeled as having DIC and I wonder whether some of this could have been small vessel ischemic necrosis in this area. Nevertheless he did have surgical IandD's of this area as well. He was readmitted on 7/29 predominantly for worsening swelling of the left thigh. He was diagnosed with left thigh myositis. As noted he did have a biopsy and was ultimately discharged on a further 2 weeks of Augmentin. A CT scan of the abdomen  showed diffuse subcutaneous edema and anasarca but without identifiable abscesses. An MRI of the femur  was also done. He is now back at home using wet-to-dry dressings daily with normal saline. The patient works as a Designer, fashion/clothing. He is Hispanic and we interviewed him through an interpreter. He does not have medical insurance 8/31; the patient is continued with his wet-to-dry dressings to the 2 areas on the flank more extensive on the right. Comes in today with both sides looking better. The left is just about 100% epithelialized. On the right he had interconnecting areas last time under bridges of skin however all of this is much less open than last week and he appears to have healthy granulation 9/21; bite injury on both sides of his abdomen with intense secondary cellulitis at presentation. He has areas on his right greater than left flank. The right with several small wounds that were connected when he first came in and I was thinking I would have to unroofed these areas from the surrounding bridges of skin however he is got onto really nice healing in this area. He still has 3 areas that are open. Inferiorly the worst however none of this really requires extensive debridement. The area on the left flank is very close to healing He has been using wet-to-dry dressings and truthfully he is done so well at it is hard to really consider changing the Electronic Signature(s) Signed: 05/22/2021 4:56:16 PM By: Baltazar Najjar MD Entered By: Baltazar Najjar on 05/22/2021 09:59:06 -------------------------------------------------------------------------------- Physical Exam Details Patient Name: Date of Service: Bryce [name] 05/22/2021 8:30 A M Medical Record Number: [id number] Patient Account Number: [id number] Date of Birth/Sex: Treating RN: October 04, 1985 (35 y.o. Elizebeth Koller Primary Care Provider: PCP, NO Other Clinician: Referring Provider: Treating Provider/Extender: Susette Racer, Ripley Fraise in Treatment: 5 Constitutional Sitting or standing Blood Pressure is within target range for patient.. Pulse regular and within target range for patient.Marland Kitchen Respirations regular, non-labored and within target range.. Temperature is normal and within the target range for the patient.Marland Kitchen Appears in no distress. Gastrointestinal (GI) Abdomen is soft and non-distended without masses or tenderness. There is some firmness in his midline abdominal incision but I think this is just probably scar tissue. There is no tenderness. Notes Wound exam Right lateral flank 3 open areas everything looks healthy here. The largest is on the most inferior wound the rest of them very tiny. I removed circumferential eschar from the top 2 areas as well as some dry flaking skin with a 3 curette there was no bleeding On the left lateral flank this is just about closed Electronic Signature(s) Signed: 05/22/2021 4:56:16 PM By: Baltazar Najjar MD Entered By: Baltazar Najjar on 05/22/2021 10:00:38 -------------------------------------------------------------------------------- Physician Orders Details Patient Name: Date of Service: Bryce [name] 05/22/2021 8:30 A M Medical Record Number: [id number] Patient Account Number: [id number] Date of Birth/Sex: Treating RN: June 04, 1986 (35 y.o. Lytle Michaels Primary Care Provider: PCP, NO Other Clinician: Referring Provider: Treating Provider/Extender: Susette Racer, Ripley Fraise in Treatment: 5 Verbal / Phone Orders: No Diagnosis Coding Follow-up Appointments Return appointment in 3 weeks. - with Dr. Leanord Hawking Bathing/ Shower/ Hygiene May shower and wash wound with soap and water. Additional Orders / Instructions Follow Nutritious Diet - High Protein Diet Wound Treatment Wound #1 - Flank Wound Laterality: Right Cleanser: Soap and Water 1 x Per Day/30 Days Discharge Instructions: May shower and wash wound with dial  antibacterial soap and water prior to dressing change. Secondary Dressing: Woven Gauze Sponge, Non-Sterile 4x4 in 1 x Per Day/30 Days Discharge Instructions: saline  moistened gauze to wound bed Secondary Dressing: ABD Pad, 5x9 1 x Per Day/30 Days Discharge Instructions: Apply over primary dressing as directed. Secured With: Paper Tape, 2x10 (in/yd) 1 x Per Day/30 Days Discharge Instructions: Secure dressing with tape as directed. Wound #2 - Flank Wound Laterality: Left Cleanser: Soap and Water 1 x Per Day/30 Days Discharge Instructions: May shower and wash wound with dial antibacterial soap and water prior to dressing change. Secondary Dressing: Woven Gauze Sponge, Non-Sterile 4x4 in 1 x Per Day/30 Days Discharge Instructions: saline moistened gauze to wound bed Secondary Dressing: ABD Pad, 5x9 1 x Per Day/30 Days Discharge Instructions: Apply over primary dressing as directed. Secured With: Paper Tape, 2x10 (in/yd) 1 x Per Day/30 Days Discharge Instructions: Secure dressing with tape as directed. Electronic Signature(s) Signed: 05/22/2021 4:56:16 PM By: Baltazar Najjar MD Signed: 05/22/2021 5:07:25 PM By: Antonieta Iba Entered By: Antonieta Iba on 05/22/2021 09:21:24 -------------------------------------------------------------------------------- Problem List Details Patient Name: Date of Service: Bryce [name] 05/22/2021 8:30 A M Medical Record Number: [id number] Patient Account Number: [id number] Date of Birth/Sex: Treating RN: 06-05-1986 (35 y.o. Elizebeth Koller Primary Care Provider: PCP, NO Other Clinician: Referring Provider: Treating Provider/Extender: Susette Racer, Ripley Fraise in Treatment: 5 Active Problems ICD-10 Encounter Code Description Active Date MDM Diagnosis S31.100D Unspecified open wound of abdominal wall, right upper quadrant without 04/17/2021 No Yes penetration into peritoneal cavity, subsequent encounter S31.101A Unspecified open  wound of abdominal wall, left upper quadrant without 04/17/2021 No Yes penetration into peritoneal cavity, initial encounter A40.0 Sepsis due to streptococcus, group A 04/17/2021 No Yes L03.311 Cellulitis of abdominal wall 04/17/2021 No Yes Inactive Problems Resolved Problems Electronic Signature(s) Signed: 05/22/2021 4:56:16 PM By: Baltazar Najjar MD Entered By: Baltazar Najjar on 05/22/2021 09:56:18 -------------------------------------------------------------------------------- Progress Note Details Patient Name: Date of Service: Bryce [name]. 05/22/2021 8:30 A M Medical Record Number: [id number] Patient Account Number: [id number] Date of Birth/Sex: Treating RN: 22-Aug-1986 (35 y.o. Elizebeth Koller Primary Care Provider: PCP, NO Other Clinician: Referring Provider: Treating Provider/Extender: Susette Racer, Ripley Fraise in Treatment: 5 Subjective History of Present Illness (HPI) ADMISSION 04/17/2021 This is a 35 year old man who came to clinic for review of wounds on his abdomen. History here is a bit complex and difficult to follow however it would appear that he suffered an abdominal bite injury on the mid part of his abdomen just in the area of his umbilicus sometime in July. The wound became necrotic and spreading. This was worsening with redness around the wound. He developed what was probably a group A strep cellulitis in his abdomen he had sepsis secondary to group A strep and required hospitalization from 03/13/2021 through 03/23/2021. During the hospitalization he required a surgical IandD by general surgery on 03/16/2021. Biopsies were done of this area in the central abdomen as well as an area on his left medial thigh just above the knee area. None of these biopsies were conclusive and he currently does not have a wound on the thigh area. The patient was critically ill with hypotension lactic acidosis. He was felt to have group A sepsis secondary to abdominal  cellulitis. He was seen by infectious disease. Initially treated with vancomycin and Rocephin and then tapered to Augmentin. The exact course of the areas on his bilateral flanks is a bit difficult to follow also however these were at 1 point blistered areas. Possible spreading cellulitis. At the variable points in the history he was also labeled as having DIC  and I wonder whether some of this could have been small vessel ischemic necrosis in this area. Nevertheless he did have surgical IandD's of this area as well. He was readmitted on 7/29 predominantly for worsening swelling of the left thigh. He was diagnosed with left thigh myositis. As noted he did have a biopsy and was ultimately discharged on a further 2 weeks of Augmentin. A CT scan of the abdomen showed diffuse subcutaneous edema and anasarca but without identifiable abscesses. An MRI of the femur was also done. He is now back at home using wet-to-dry dressings daily with normal saline. The patient works as a Designer, fashion/clothing. He is Hispanic and we interviewed him through an interpreter. He does not have medical insurance 8/31; the patient is continued with his wet-to-dry dressings to the 2 areas on the flank more extensive on the right. Comes in today with both sides looking better. The left is just about 100% epithelialized. On the right he had interconnecting areas last time under bridges of skin however all of this is much less open than last week and he appears to have healthy granulation 9/21; bite injury on both sides of his abdomen with intense secondary cellulitis at presentation. He has areas on his right greater than left flank. The right with several small wounds that were connected when he first came in and I was thinking I would have to unroofed these areas from the surrounding bridges of skin however he is got onto really nice healing in this area. He still has 3 areas that are open. Inferiorly the worst however none of this really  requires extensive debridement. oo The area on the left flank is very close to healing oo He has been using wet-to-dry dressings and truthfully he is done so well at it is hard to really consider changing the Objective Constitutional Sitting or standing Blood Pressure is within target range for patient.. Pulse regular and within target range for patient.Marland Kitchen Respirations regular, non-labored and within target range.. Temperature is normal and within the target range for the patient.Marland Kitchen Appears in no distress. Vitals Time Taken: 8:58 AM, Height: 61 in, Weight: 127 lbs, BMI: 24, Temperature: 98 F, Pulse: 60 bpm, Respiratory Rate: 16 breaths/min, Blood Pressure: 130/79 mmHg. Gastrointestinal (GI) Abdomen is soft and non-distended without masses or tenderness. There is some firmness in his midline abdominal incision but I think this is just probably scar tissue. There is no tenderness. General Notes: Wound exam oo Right lateral flank 3 open areas everything looks healthy here. The largest is on the most inferior wound the rest of them very tiny. I removed circumferential eschar from the top 2 areas as well as some dry flaking skin with a 3 curette there was no bleeding oo On the left lateral flank this is just about closed Integumentary (Hair, Skin) Wound #1 status is Open. Original cause of wound was Other Lesion. The date acquired was: 03/18/2021. The wound has been in treatment 5 weeks. The wound is located on the Right Flank. The wound measures 0.9cm length x 2.5cm width x 0.1cm depth; 1.767cm^2 area and 0.177cm^3 volume. There is Fat Layer (Subcutaneous Tissue) exposed. There is a medium amount of serosanguineous drainage noted. The wound margin is distinct with the outline attached to the wound base. There is large (67-100%) red granulation within the wound bed. There is a small (1-33%) amount of necrotic tissue within the wound bed including Adherent Slough. Wound #2 status is Open. Original  cause of wound was Other  Lesion. The date acquired was: 03/18/2021. The wound has been in treatment 5 weeks. The wound is located on the Left Flank. The wound measures 0.3cm length x 1.8cm width x 0.1cm depth; 0.424cm^2 area and 0.042cm^3 volume. There is Fat Layer (Subcutaneous Tissue) exposed. There is no tunneling or undermining noted. There is a medium amount of serosanguineous drainage noted. The wound margin is distinct with the outline attached to the wound base. There is large (67-100%) red granulation within the wound bed. There is no necrotic tissue within the wound bed. Assessment Active Problems ICD-10 Unspecified open wound of abdominal wall, right upper quadrant without penetration into peritoneal cavity, subsequent encounter Unspecified open wound of abdominal wall, left upper quadrant without penetration into peritoneal cavity, initial encounter Sepsis due to streptococcus, group A Cellulitis of abdominal wall Procedures Wound #1 Pre-procedure diagnosis of Wound #1 is an Infection - not elsewhere classified located on the Right Flank . There was a Selective/Open Wound Skin/Dermis Debridement with a total area of 2.25 sq cm performed by Maxwell Caul., MD. With the following instrument(s): Curette to remove Non-Viable tissue/material. Material removed includes Eschar, Callus, and Skin: Dermis after achieving pain control using Lidocaine 4% Topical Solution. No specimens were taken. A time out was conducted at 09:14, prior to the start of the procedure. A Minimum amount of bleeding was controlled with Pressure. The procedure was tolerated well. Post Debridement Measurements: 0.9cm length x 2.5cm width x 0.1cm depth; 0.177cm^3 volume. Character of Wound/Ulcer Post Debridement is stable. Post procedure Diagnosis Wound #1: Same as Pre-Procedure Plan Follow-up Appointments: Return appointment in 3 weeks. - with Dr. Leanord Hawking Bathing/ Shower/ Hygiene: May shower and wash wound with  soap and water. Additional Orders / Instructions: Follow Nutritious Diet - High Protein Diet WOUND #1: - Flank Wound Laterality: Right Cleanser: Soap and Water 1 x Per Day/30 Days Discharge Instructions: May shower and wash wound with dial antibacterial soap and water prior to dressing change. Secondary Dressing: Woven Gauze Sponge, Non-Sterile 4x4 in 1 x Per Day/30 Days Discharge Instructions: saline moistened gauze to wound bed Secondary Dressing: ABD Pad, 5x9 1 x Per Day/30 Days Discharge Instructions: Apply over primary dressing as directed. Secured With: Paper T ape, 2x10 (in/yd) 1 x Per Day/30 Days Discharge Instructions: Secure dressing with tape as directed. WOUND #2: - Flank Wound Laterality: Left Cleanser: Soap and Water 1 x Per Day/30 Days Discharge Instructions: May shower and wash wound with dial antibacterial soap and water prior to dressing change. Secondary Dressing: Woven Gauze Sponge, Non-Sterile 4x4 in 1 x Per Day/30 Days Discharge Instructions: saline moistened gauze to wound bed Secondary Dressing: ABD Pad, 5x9 1 x Per Day/30 Days Discharge Instructions: Apply over primary dressing as directed. Secured With: Paper Tape, 2x10 (in/yd) 1 x Per Day/30 Days Discharge Instructions: Secure dressing with tape as directed. 1. I continued with the wet dry to dry dressings. I suspect this will be healed by the next time he comes here. Electronic Signature(s) Signed: 05/22/2021 4:56:16 PM By: Baltazar Najjar MD Entered By: Baltazar Najjar on 05/22/2021 10:01:42 -------------------------------------------------------------------------------- SuperBill Details Patient Name: Date of Service: Bryce [name] 05/22/2021 Medical Record Number: [id number] Patient Account Number: [id number] Date of Birth/Sex: Treating RN: [birth year] (34 y.o. Lytle Michaels Primary Care Provider: PCP, NO Other Clinician: Referring Provider: Treating Provider/Extender: Susette Racer, Ripley Fraise in Treatment: 5 Diagnosis Coding ICD-10 Codes Code Description S31.100D Unspecified open wound of abdominal wall, right upper quadrant without penetration into peritoneal cavity,  subsequent encounter S31.101A Unspecified open wound of abdominal wall, left upper quadrant without penetration into peritoneal cavity, initial encounter A40.0 Sepsis due to streptococcus, group A L03.311 Cellulitis of abdominal wall Facility Procedures CPT4 Code Description: [id number] - DEBRIDE WOUND 1ST 20 SQ CM OR < ICD-10 Diagnosis Description S31.100D Unspecified open wound of abdominal wall, right upper quadrant without penetration i subsequent encounter Modifier: nto peritoneal cavi Quantity: 1 ty, Physician Procedures : CPT4 Code Description Modifier [id number] - WC PHYS DEBR WO ANESTH 20 SQ CM ICD-10 Diagnosis Description S31.100D Unspecified open wound of abdominal wall, right upper quadrant without penetration into peritoneal cavit subsequent encounter Quantity: 1 y, Psychologist, [facility]) Signed: 05/22/2021 4:56:16 PM By: Baltazar Najjar MD Entered By: Baltazar Najjar on 05/22/2021 10:01:57

## 2021-06-12 ENCOUNTER — Encounter (HOSPITAL_BASED_OUTPATIENT_CLINIC_OR_DEPARTMENT_OTHER): Payer: No Typology Code available for payment source | Attending: Internal Medicine | Admitting: Internal Medicine
